# Patient Record
Sex: Male | Born: 1955 | Race: Black or African American | Hispanic: No | Smoking: Current every day smoker
Health system: Southern US, Community
[De-identification: ages and names within clinical notes are randomized; demographics above are authoritative.]

## PROBLEM LIST (undated history)

## (undated) DIAGNOSIS — I639 Cerebral infarction, unspecified: Secondary | ICD-10-CM

## (undated) DIAGNOSIS — G8220 Paraplegia, unspecified: Secondary | ICD-10-CM

## (undated) DIAGNOSIS — F32A Depression, unspecified: Secondary | ICD-10-CM

## (undated) DIAGNOSIS — F329 Major depressive disorder, single episode, unspecified: Secondary | ICD-10-CM

## (undated) DIAGNOSIS — M545 Low back pain: Secondary | ICD-10-CM

## (undated) DIAGNOSIS — Z9289 Personal history of other medical treatment: Secondary | ICD-10-CM

## (undated) DIAGNOSIS — K219 Gastro-esophageal reflux disease without esophagitis: Secondary | ICD-10-CM

## (undated) DIAGNOSIS — I10 Essential (primary) hypertension: Secondary | ICD-10-CM

## (undated) DIAGNOSIS — D649 Anemia, unspecified: Secondary | ICD-10-CM

## (undated) DIAGNOSIS — B20 Human immunodeficiency virus [HIV] disease: Secondary | ICD-10-CM

## (undated) DIAGNOSIS — G8929 Other chronic pain: Secondary | ICD-10-CM

---
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
## 1997-05-04 ENCOUNTER — Encounter (INDEPENDENT_AMBULATORY_CARE_PROVIDER_SITE_OTHER): Payer: Self-pay | Admitting: *Deleted

## 2001-07-26 ENCOUNTER — Emergency Department (HOSPITAL_COMMUNITY): Admission: EM | Admit: 2001-07-26 | Discharge: 2001-07-26 | Payer: Self-pay | Admitting: Emergency Medicine

## 2003-04-24 ENCOUNTER — Emergency Department (HOSPITAL_COMMUNITY): Admission: EM | Admit: 2003-04-24 | Discharge: 2003-04-24 | Payer: Self-pay | Admitting: Emergency Medicine

## 2003-12-02 ENCOUNTER — Emergency Department (HOSPITAL_COMMUNITY): Admission: EM | Admit: 2003-12-02 | Discharge: 2003-12-02 | Payer: Self-pay | Admitting: Emergency Medicine

## 2004-06-25 ENCOUNTER — Ambulatory Visit (HOSPITAL_COMMUNITY): Admission: RE | Admit: 2004-06-25 | Discharge: 2004-06-25 | Payer: Self-pay | Admitting: Emergency Medicine

## 2004-07-22 ENCOUNTER — Ambulatory Visit: Payer: Self-pay | Admitting: Infectious Diseases

## 2004-07-22 ENCOUNTER — Encounter (INDEPENDENT_AMBULATORY_CARE_PROVIDER_SITE_OTHER): Payer: Self-pay | Admitting: *Deleted

## 2004-07-22 ENCOUNTER — Ambulatory Visit (HOSPITAL_COMMUNITY): Admission: RE | Admit: 2004-07-22 | Discharge: 2004-07-22 | Payer: Self-pay | Admitting: Infectious Diseases

## 2004-09-03 ENCOUNTER — Ambulatory Visit: Payer: Self-pay | Admitting: Infectious Diseases

## 2004-11-05 ENCOUNTER — Ambulatory Visit: Payer: Self-pay | Admitting: Internal Medicine

## 2004-11-12 ENCOUNTER — Ambulatory Visit: Payer: Self-pay | Admitting: Infectious Diseases

## 2004-12-10 ENCOUNTER — Emergency Department (HOSPITAL_COMMUNITY): Admission: EM | Admit: 2004-12-10 | Discharge: 2004-12-11 | Payer: Self-pay | Admitting: Emergency Medicine

## 2005-01-03 ENCOUNTER — Ambulatory Visit: Payer: Self-pay | Admitting: Infectious Diseases

## 2005-01-16 ENCOUNTER — Ambulatory Visit: Payer: Self-pay | Admitting: Infectious Diseases

## 2005-01-28 ENCOUNTER — Ambulatory Visit: Payer: Self-pay | Admitting: Infectious Diseases

## 2005-02-25 ENCOUNTER — Ambulatory Visit: Payer: Self-pay | Admitting: Infectious Diseases

## 2005-03-24 ENCOUNTER — Ambulatory Visit: Payer: Self-pay | Admitting: Infectious Diseases

## 2005-05-28 ENCOUNTER — Ambulatory Visit: Payer: Self-pay | Admitting: Infectious Diseases

## 2005-10-06 ENCOUNTER — Encounter (INDEPENDENT_AMBULATORY_CARE_PROVIDER_SITE_OTHER): Payer: Self-pay | Admitting: *Deleted

## 2005-10-06 ENCOUNTER — Ambulatory Visit: Payer: Self-pay | Admitting: Infectious Diseases

## 2005-10-06 LAB — CONVERTED CEMR LAB: CD4 Count: 641 microliters

## 2005-10-07 ENCOUNTER — Ambulatory Visit (HOSPITAL_COMMUNITY): Admission: RE | Admit: 2005-10-07 | Discharge: 2005-10-07 | Payer: Self-pay | Admitting: Infectious Diseases

## 2005-10-08 ENCOUNTER — Ambulatory Visit: Payer: Self-pay | Admitting: Infectious Diseases

## 2005-10-24 ENCOUNTER — Ambulatory Visit: Payer: Self-pay | Admitting: Infectious Diseases

## 2005-10-24 ENCOUNTER — Ambulatory Visit (HOSPITAL_COMMUNITY): Admission: RE | Admit: 2005-10-24 | Discharge: 2005-10-24 | Payer: Self-pay | Admitting: Infectious Diseases

## 2005-12-30 ENCOUNTER — Ambulatory Visit: Payer: Self-pay | Admitting: Infectious Diseases

## 2006-03-24 ENCOUNTER — Ambulatory Visit: Payer: Self-pay | Admitting: Infectious Diseases

## 2006-03-24 ENCOUNTER — Encounter (INDEPENDENT_AMBULATORY_CARE_PROVIDER_SITE_OTHER): Payer: Self-pay | Admitting: *Deleted

## 2006-03-24 LAB — CONVERTED CEMR LAB: HIV 1 RNA Quant: 3467 copies/mL

## 2006-10-26 ENCOUNTER — Encounter (INDEPENDENT_AMBULATORY_CARE_PROVIDER_SITE_OTHER): Payer: Self-pay | Admitting: *Deleted

## 2006-10-26 LAB — CONVERTED CEMR LAB

## 2006-11-08 ENCOUNTER — Encounter (INDEPENDENT_AMBULATORY_CARE_PROVIDER_SITE_OTHER): Payer: Self-pay | Admitting: *Deleted

## 2008-08-11 DIAGNOSIS — F17200 Nicotine dependence, unspecified, uncomplicated: Secondary | ICD-10-CM | POA: Insufficient documentation

## 2008-08-11 DIAGNOSIS — B20 Human immunodeficiency virus [HIV] disease: Secondary | ICD-10-CM | POA: Insufficient documentation

## 2008-08-11 DIAGNOSIS — I10 Essential (primary) hypertension: Secondary | ICD-10-CM

## 2008-08-11 DIAGNOSIS — Z8679 Personal history of other diseases of the circulatory system: Secondary | ICD-10-CM | POA: Insufficient documentation

## 2008-08-11 DIAGNOSIS — K219 Gastro-esophageal reflux disease without esophagitis: Secondary | ICD-10-CM

## 2008-08-11 DIAGNOSIS — Z87442 Personal history of urinary calculi: Secondary | ICD-10-CM

## 2008-09-01 DIAGNOSIS — Z9289 Personal history of other medical treatment: Secondary | ICD-10-CM

## 2008-09-01 DIAGNOSIS — I639 Cerebral infarction, unspecified: Secondary | ICD-10-CM

## 2008-09-01 HISTORY — DX: Cerebral infarction, unspecified: I63.9

## 2008-09-01 HISTORY — DX: Personal history of other medical treatment: Z92.89

## 2008-09-20 ENCOUNTER — Ambulatory Visit: Payer: Self-pay | Admitting: Infectious Diseases

## 2008-09-20 LAB — CONVERTED CEMR LAB
ALT: 14 units/L (ref 0–53)
Albumin: 4.7 g/dL (ref 3.5–5.2)
BUN: 8 mg/dL (ref 6–23)
Bacteria, UA: NONE SEEN
CO2: 20 meq/L (ref 19–32)
Calcium: 9.3 mg/dL (ref 8.4–10.5)
Chloride: 103 meq/L (ref 96–112)
Cholesterol: 145 mg/dL (ref 0–200)
Creatinine, Ser: 0.94 mg/dL (ref 0.40–1.50)
GC Probe Amp, Urine: NEGATIVE
HCT: 39 % (ref 39.0–52.0)
Hemoglobin, Urine: NEGATIVE
Hemoglobin: 13.1 g/dL (ref 13.0–17.0)
Leukocytes, UA: NEGATIVE
Nitrite: NEGATIVE
Platelets: 330 10*3/uL (ref 150–400)
Potassium: 4.5 meq/L (ref 3.5–5.3)
RBC / HPF: NONE SEEN (ref <3)
Specific Gravity, Urine: 1.024 (ref 1.005–1.03)
Total CHOL/HDL Ratio: 3.4
Urine Glucose: NEGATIVE mg/dL
WBC: 5.8 10*3/uL (ref 4.0–10.5)
pH: 6 (ref 5.0–8.0)

## 2008-10-11 ENCOUNTER — Telehealth (INDEPENDENT_AMBULATORY_CARE_PROVIDER_SITE_OTHER): Payer: Self-pay | Admitting: *Deleted

## 2008-11-22 ENCOUNTER — Ambulatory Visit: Payer: Self-pay | Admitting: Infectious Diseases

## 2008-11-22 DIAGNOSIS — H6120 Impacted cerumen, unspecified ear: Secondary | ICD-10-CM

## 2008-11-22 DIAGNOSIS — F528 Other sexual dysfunction not due to a substance or known physiological condition: Secondary | ICD-10-CM

## 2008-11-27 ENCOUNTER — Encounter (INDEPENDENT_AMBULATORY_CARE_PROVIDER_SITE_OTHER): Payer: Self-pay | Admitting: *Deleted

## 2008-12-05 ENCOUNTER — Encounter: Payer: Self-pay | Admitting: Infectious Diseases

## 2008-12-15 ENCOUNTER — Telehealth (INDEPENDENT_AMBULATORY_CARE_PROVIDER_SITE_OTHER): Payer: Self-pay | Admitting: *Deleted

## 2009-03-19 ENCOUNTER — Encounter (INDEPENDENT_AMBULATORY_CARE_PROVIDER_SITE_OTHER): Payer: Self-pay | Admitting: *Deleted

## 2009-08-15 ENCOUNTER — Encounter: Payer: Self-pay | Admitting: Emergency Medicine

## 2009-08-15 ENCOUNTER — Inpatient Hospital Stay (HOSPITAL_COMMUNITY): Admission: AD | Admit: 2009-08-15 | Discharge: 2009-10-12 | Payer: Self-pay | Admitting: Internal Medicine

## 2009-08-15 ENCOUNTER — Ambulatory Visit: Payer: Self-pay | Admitting: Internal Medicine

## 2009-08-16 ENCOUNTER — Ambulatory Visit: Payer: Self-pay | Admitting: Physical Medicine & Rehabilitation

## 2009-08-16 ENCOUNTER — Encounter (INDEPENDENT_AMBULATORY_CARE_PROVIDER_SITE_OTHER): Payer: Self-pay | Admitting: Neurology

## 2009-08-17 ENCOUNTER — Ambulatory Visit: Payer: Self-pay | Admitting: Infectious Diseases

## 2009-08-30 ENCOUNTER — Encounter (INDEPENDENT_AMBULATORY_CARE_PROVIDER_SITE_OTHER): Payer: Self-pay | Admitting: Cardiology

## 2009-09-01 HISTORY — PX: TOE AMPUTATION: SHX809

## 2009-09-03 ENCOUNTER — Ambulatory Visit: Payer: Self-pay | Admitting: Vascular Surgery

## 2009-09-19 ENCOUNTER — Ambulatory Visit: Payer: Self-pay | Admitting: Vascular Surgery

## 2009-09-20 ENCOUNTER — Encounter: Payer: Self-pay | Admitting: Infectious Diseases

## 2009-09-20 ENCOUNTER — Encounter: Payer: Self-pay | Admitting: Internal Medicine

## 2009-10-19 ENCOUNTER — Encounter: Payer: Self-pay | Admitting: Infectious Diseases

## 2009-10-24 ENCOUNTER — Ambulatory Visit: Payer: Self-pay | Admitting: Infectious Diseases

## 2009-10-24 DIAGNOSIS — IMO0002 Reserved for concepts with insufficient information to code with codable children: Secondary | ICD-10-CM

## 2009-10-24 DIAGNOSIS — G8220 Paraplegia, unspecified: Secondary | ICD-10-CM

## 2009-10-24 LAB — CONVERTED CEMR LAB
ALT: 48 units/L (ref 0–53)
AST: 51 units/L — ABNORMAL HIGH (ref 0–37)
BUN: 12 mg/dL (ref 6–23)
Basophils Relative: 0 % (ref 0–1)
Calcium: 9.7 mg/dL (ref 8.4–10.5)
Chloride: 104 meq/L (ref 96–112)
Creatinine, Ser: 0.94 mg/dL (ref 0.40–1.50)
Eosinophils Absolute: 0.3 10*3/uL (ref 0.0–0.7)
Eosinophils Relative: 3 % (ref 0–5)
HCT: 31.8 % — ABNORMAL LOW (ref 39.0–52.0)
MCHC: 32.1 g/dL (ref 30.0–36.0)
MCV: 86.9 fL (ref >=78.0)
Neutrophils Relative %: 51 % (ref 43–77)
Platelets: 758 10*3/uL — ABNORMAL HIGH (ref 150–400)
RDW: 15.9 % — ABNORMAL HIGH (ref 11.5–15.5)
Total Bilirubin: 0.4 mg/dL (ref 0.3–1.2)

## 2009-10-25 ENCOUNTER — Ambulatory Visit: Payer: Self-pay | Admitting: Internal Medicine

## 2009-10-25 DIAGNOSIS — K2990 Gastroduodenitis, unspecified, without bleeding: Secondary | ICD-10-CM

## 2009-10-25 DIAGNOSIS — M2060 Acquired deformities of toe(s), unspecified, unspecified foot: Secondary | ICD-10-CM | POA: Insufficient documentation

## 2009-10-25 DIAGNOSIS — K2970 Gastritis, unspecified, without bleeding: Secondary | ICD-10-CM

## 2009-10-26 ENCOUNTER — Encounter: Payer: Self-pay | Admitting: Infectious Diseases

## 2009-10-29 ENCOUNTER — Encounter: Payer: Self-pay | Admitting: Infectious Diseases

## 2009-10-29 LAB — CONVERTED CEMR LAB: HIV-1 RNA Quant, Log: 4.09 — ABNORMAL HIGH (ref <1.68)

## 2009-11-14 ENCOUNTER — Ambulatory Visit (HOSPITAL_COMMUNITY): Admission: RE | Admit: 2009-11-14 | Discharge: 2009-11-14 | Payer: Self-pay | Admitting: General Surgery

## 2009-11-22 ENCOUNTER — Ambulatory Visit (HOSPITAL_COMMUNITY): Admission: RE | Admit: 2009-11-22 | Discharge: 2009-11-22 | Payer: Self-pay | Admitting: Orthopedic Surgery

## 2010-02-05 ENCOUNTER — Ambulatory Visit: Payer: Self-pay | Admitting: Internal Medicine

## 2010-02-05 ENCOUNTER — Encounter: Payer: Self-pay | Admitting: Internal Medicine

## 2010-02-05 ENCOUNTER — Inpatient Hospital Stay (HOSPITAL_COMMUNITY): Admission: EM | Admit: 2010-02-05 | Discharge: 2010-02-15 | Payer: Self-pay | Admitting: Emergency Medicine

## 2010-02-08 ENCOUNTER — Encounter: Payer: Self-pay | Admitting: Internal Medicine

## 2010-02-12 ENCOUNTER — Encounter: Payer: Self-pay | Admitting: Internal Medicine

## 2010-02-12 ENCOUNTER — Ambulatory Visit: Payer: Self-pay | Admitting: Surgery

## 2010-02-13 ENCOUNTER — Ambulatory Visit: Payer: Self-pay | Admitting: Internal Medicine

## 2010-02-15 ENCOUNTER — Encounter: Payer: Self-pay | Admitting: Internal Medicine

## 2010-02-15 DIAGNOSIS — N2589 Other disorders resulting from impaired renal tubular function: Secondary | ICD-10-CM | POA: Insufficient documentation

## 2010-02-15 DIAGNOSIS — D638 Anemia in other chronic diseases classified elsewhere: Secondary | ICD-10-CM | POA: Insufficient documentation

## 2010-02-15 DIAGNOSIS — E872 Acidosis: Secondary | ICD-10-CM

## 2010-02-18 ENCOUNTER — Ambulatory Visit: Payer: Self-pay | Admitting: Internal Medicine

## 2010-02-20 LAB — CONVERTED CEMR LAB
CO2: 22 meq/L (ref 19–32)
Calcium: 8.9 mg/dL (ref 8.4–10.5)
Creatinine, Ser: 1.37 mg/dL (ref 0.40–1.50)
Glucose, Bld: 140 mg/dL — ABNORMAL HIGH (ref 70–99)
Phosphorus: 2.4 mg/dL (ref 2.3–4.6)
Prothrombin Time: 13.2 s (ref 11.6–15.2)
Sodium: 136 meq/L (ref 135–145)
aPTT: 31 s

## 2010-03-18 ENCOUNTER — Ambulatory Visit: Payer: Self-pay | Admitting: Infectious Diseases

## 2010-07-30 ENCOUNTER — Encounter: Payer: Self-pay | Admitting: Internal Medicine

## 2010-07-30 ENCOUNTER — Ambulatory Visit: Payer: Self-pay | Admitting: Infectious Diseases

## 2010-07-30 LAB — CONVERTED CEMR LAB
AST: 17 units/L (ref 0–37)
Alkaline Phosphatase: 103 units/L (ref 39–117)
BUN: 20 mg/dL (ref 6–23)
Basophils Relative: 0 % (ref 0–1)
Creatinine, Ser: 0.8 mg/dL (ref 0.40–1.50)
Eosinophils Absolute: 0.2 10*3/uL (ref 0.0–0.7)
Eosinophils Relative: 3 % (ref 0–5)
Glucose, Bld: 90 mg/dL (ref 70–99)
HCT: 33.8 % — ABNORMAL LOW (ref 39.0–52.0)
HIV 1 RNA Quant: <20 copies/mL (ref <20)
HIV-1 RNA Quant, Log: <1.3 (ref <1.30)
Hemoglobin: 11.4 g/dL — ABNORMAL LOW (ref 13.0–17.0)
MCHC: 33.7 g/dL (ref 30.0–36.0)
MCV: 97.1 fL (ref 78.0–100.0)
Monocytes Absolute: 0.8 10*3/uL (ref 0.1–1.0)
Monocytes Relative: 10 % (ref 3–12)
Neutro Abs: 4.1 10*3/uL (ref 1.7–7.7)
RBC: 3.48 M/uL — ABNORMAL LOW (ref 4.22–5.81)
RDW: 14.2 % (ref 11.5–15.5)
Total Bilirubin: 0.5 mg/dL (ref 0.3–1.2)

## 2010-08-15 ENCOUNTER — Encounter
Admission: RE | Admit: 2010-08-15 | Discharge: 2010-08-15 | Payer: Self-pay | Source: Home / Self Care | Attending: Internal Medicine | Admitting: Internal Medicine

## 2010-09-25 ENCOUNTER — Encounter: Payer: Self-pay | Admitting: Orthopedic Surgery

## 2010-10-01 NOTE — Consult Note (Signed)
Summary: [facility]   Imported By: Florinda Marker 11/20/2009 14:43:22  _____________________________________________________________________  External Attachment:    Type:   Image     Comment:   External Document

## 2010-10-01 NOTE — Discharge Summary (Signed)
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  His initial Hb was 6.9.  He received 2 U RBCs with improvement in Hb, however it slowly trended downwards.  He was initially heme occult negative, but repeat was positive.  GI was consulted and conducted an EGD, which showed gastric mass/scar.  Biopsy showed benign inflammation and gastritis.  The patient continued to have a slow downward trend in Hb, and Heme/Onc was consulted, who recommended BM bx.  This was done on day of discharge.  Patient's Hb stabilized at 8.4.  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  All HAART regimen held and bicarb therapy started with improvement in renal function.  Needs follow-up B-met to assess.  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) Added new problem of OTH SPEC D/O RESULT FROM IMPAIRED RENAL FUNCTION (ICD-588.89) Added new problem of ANEMIA OF OTHER CHRONIC DISEASE (ICD-285.29)  Medication list changes:  Removed medication of TRUVADA 200-300 MG TABS (EMTRICITABINE-TENOFOVIR) Take 1 tablet by mouth once a day Removed medication of NORVIR 100 MG TABS (RITONAVIR) Take 1 tablet by mouth once a day Removed medication of PREZISTA 400 MG TABS (DARUNAVIR ETHANOLATE) 2 tab by mouth once daily Added new medication of MAG-OX 400 400 MG TABS (MAGNESIUM OXIDE) Take 1 tab by mouth two times  a day Removed medication of METOPROLOL TARTRATE 25 MG TABS (METOPROLOL TARTRATE) 1 tab two times a day Changed medication from ZOLOFT 100 MG TABS (SERTRALINE HCL) Take 1 tablet by mouth once a day to ZOLOFT 100 MG TABS (SERTRALINE HCL) Take 1 tab by mouth daily Added new medication of ZOLOFT 50 MG TABS (SERTRALINE HCL) Take 1 tab by mouth daily Added new medication of AMBIEN 5 MG TABS (ZOLPIDEM TARTRATE) Take 1 tab by mouth at bedtime Changed medication from PROTONIX 40 MG TBEC (PANTOPRAZOLE SODIUM) Take 1 tablet by mouth once a day to PRILOSEC 20 MG CPDR (OMEPRAZOLE) Take 1 cap by mouth daily Removed medication of NICODERM CQ 7 MG/24HR PT24 (NICOTINE) once daily  The medication, problem, and allergy lists have been updated.  Please see the dictated discharge summary for details.  Discharge medications:  MULTIVITAL  TABS (MULTIPLE VITAMINS-MINERALS) one daily WARFARIN SODIUM 2 MG TABS (WARFARIN SODIUM) Take 1 tablet by mouth once a day ZOLOFT 100 MG TABS (SERTRALINE HCL) Take 1 tab by mouth daily PRAVACHOL 40 MG TABS (PRAVASTATIN SODIUM) Take 1 tablet by mouth at bedtime PRILOSEC 20 MG CPDR (OMEPRAZOLE) Take 1 cap by mouth daily ENABLEX 7.5 MG XR24H-TAB (DARIFENACIN HYDROBROMIDE) Take 1 tablet by mouth once a day MAG-OX 400 400 MG TABS (MAGNESIUM OXIDE) Take 1 tab by mouth two times a day ZOLOFT 50 MG TABS (SERTRALINE HCL) Take 1 tab by mouth daily AMBIEN 5 MG TABS (ZOLPIDEM TARTRATE) Take 1 tab by mouth at bedtime  Other patient instructions:  1) Follow up with Dr. Aleene Davidson in the [facility] outpatient clinic on  Monday, June 20 at 1:30 pm.  Please arrive by 1 pm.  If you have questions please call [phone]. 2) Please follow up with Dr. Ninetta Lights in the [facility] on July 18 at 9:45 am.  Please call [phone] with questions.   3) Please review your new medication list and take all medications as prescribed.  New medications in clude Mag-Ox 400 mg  tab twice a day.   4) Return to the emergency room if you have fevers, chills, chest pain, blackouts, persistent nausea or vomiting, or acute numbness, tingling, or weakness.      Note: Hospital Discharge Medications & Other Instructions handout was printed, one copy for patient and a second copy to be placed in hospital chart.

## 2010-10-01 NOTE — Assessment & Plan Note (Signed)
Summary: HFU-PER DR. MILLS STAT LABS PT COMING AT 12:30P.M./[facility]   Vital Signs:  Patient profile:   55 year old male Height:      72 inches (182.88 cm) Weight:      116.1 pounds (52.77 kg) BMI:     15.80 Temp:     97.1 degrees F (36.17 degrees C) oral Pulse rate:   86 / minute BP sitting:   150 / 93  (left arm)  Vitals Entered By: Kent Kidney Ditzler RN (February 18, 2010 2:06 PM) Is Patient Diabetic? No Pain Assessment Patient in pain? no      Nutritional Status BMI of < 19 = underweight Nutritional Status Detail appetite good  Have you ever been in a relationship where you felt threatened, hurt or afraid?denies   Does patient need assistance? Functional Status Self care Ambulation Wheelchair Comments HFU - doing well.   History of Present Illness: Kent Barnes comes for a hfu. No black stool. No vomiting or hematemesis. He feels fine. Mood is good. No SI. He says life is "fine".   Depression History:      The patient denies a depressed mood most of the day and a diminished interest in his usual daily activities.         Preventive Screening-Counseling & Management  Alcohol-Tobacco     Alcohol drinks/day: 0     Alcohol type: beer     Smoking Status: quit     Smoking Cessation Counseling: yes     Packs/Day: 0.25     Year Quit: 2010  Caffeine-Diet-Exercise     Caffeine use/day: coffee sometimes,tea,sodas occassionally     Does Patient Exercise: yes     Type of exercise: PT     Exercise (avg: min/session): >60     Times/week: 5  Current Medications (verified): 1)  Multivital  Tabs (Multiple Vitamins-Minerals) .... One Daily 2)  Warfarin Sodium 2 Mg Tabs (Warfarin Sodium) .... Take 1 Tablet By Mouth Once A Day 3)  Zoloft 100 Mg Tabs (Sertraline Hcl) .... Take 1 Tab By Mouth Daily 4)  Pravachol 40 Mg Tabs (Pravastatin Sodium) .... Take 1 Tablet By Mouth At Bedtime 5)  Prilosec 20 Mg Cpdr (Omeprazole) .... Take 1 Cap By Mouth Daily 6)  Enablex 7.5 Mg Xr24h-Tab  (Darifenacin Hydrobromide) .... Take 1 Tablet By Mouth Once A Day 7)  Mag-Ox 400 400 Mg Tabs (Magnesium Oxide) .... Take 1 Tab By Mouth Two Times A Day 8)  Zoloft 50 Mg Tabs (Sertraline Hcl) .... Take 1 Tab By Mouth Daily 9)  Ambien 5 Mg Tabs (Zolpidem Tartrate) .... Take 1 Tab By Mouth At Bedtime  Allergies: 1)  ! Septra 2)  ! Codeine 3)  ! Sulfa  Review of Systems      See HPI  Physical Exam  Lungs:  normal breath sounds, no crackles, and no wheezes.   Heart:  normal rate, regular rhythm, no murmur, and no gallop.   Extremities:  trace left pedal edema and trace right pedal edema.     Impression & Recommendations:  Problem # 1:  OTH SPEC D/O RESULT FROM IMPAIRED RENAL FUNCTION (ICD-588.89) As recommended in his recent discharge summary, I will check following labs. Will start his HCo3 pills if his HCO# is <20, it was 22 at the time of discharge.  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  Orders: T-Basic Metabolic Panel [id number]) T-Magnesium [id number]) T-Phosphorus [phone])  Problem #  3:  CHRN/UNS GASTR ULCR W/HEMORR&PERF W/O OBST (ICD-531.60) Will check PT/aPTT.  His updated medication list for this problem includes:    Prilosec 20 Mg Cpdr (Omeprazole) .Marland Kitchen... Take 1 cap by mouth daily    Mag-ox 400 400 Mg Tabs (Magnesium oxide) .Marland Kitchen... Take 1 tab by mouth two times a day  Problem # 4:  HYPERTENSION (ICD-401.9)  BP little elevated today. His BP was 110-140/80s in the hospital. WIll start on low dose norvasc as this does not interact with any eletrolytes and f/u in a month.  BP today: 150/93 Prior BP: 132/87 (10/25/2009)  Labs Reviewed: K+: 4.2 (10/24/2009) Creat: : 0.94 (10/24/2009)   Chol: 145 (09/20/2008)   HDL: 43 (09/20/2008)   LDL: 72 (09/20/2008)   TG: 148 (09/20/2008)  His updated medication list for this problem includes:    Norvasc  5 Mg Tabs (Amlodipine besylate) .Marland Kitchen... Take 1 pill by mouth daily.  Complete Medication List: 1)  Multivital Tabs (Multiple vitamins-minerals) .... One daily 2)  Warfarin Sodium 2 Mg Tabs (Warfarin sodium) .... Take 1 tablet by mouth once a day 3)  Zoloft 100 Mg Tabs (Sertraline hcl) .... Take 1 tab by mouth daily 4)  Pravachol 40 Mg Tabs (Pravastatin sodium) .... Take 1 tablet by mouth at bedtime 5)  Prilosec 20 Mg Cpdr (Omeprazole) .... Take 1 cap by mouth daily 6)  Enablex 7.5 Mg Xr24h-tab (Darifenacin hydrobromide) .... Take 1 tablet by mouth once a day 7)  Mag-ox 400 400 Mg Tabs (Magnesium oxide) .... Take 1 tab by mouth two times a day 8)  Zoloft 50 Mg Tabs (Sertraline hcl) .... Take 1 tab by mouth daily 9)  Ambien 5 Mg Tabs (Zolpidem tartrate) .... Take 1 tab by mouth at bedtime 10)  Norvasc 5 Mg Tabs (Amlodipine besylate) .... Take 1 pill by mouth daily.  Other Orders: T-PTT ([id number]) T-Protime, Auto ([id number])  Patient Instructions: 1)  Please schedule a follow-up appointment in 1 month. 2)  Limit your Sodium (Salt) to less than 2 grams a day(slightly less than 1/2 a teaspoon) to prevent fluid retention, swelling, or worsening of symptoms. 3)  Tobacco is very bad for your health and your loved ones! You Should stop smoking!. 4)  Stop Smoking Tips: Choose a Quit date. Cut down before the Quit date. decide what you will do as a substitute when you feel the urge to smoke(gum,toothpick,exercise). 5)  Check your Blood Pressure regularly. If it is above: you should make an appointment. Prescriptions: NORVASC 5 MG TABS (AMLODIPINE BESYLATE) take 1 pill by mouth daily.  #30 x 0   Entered and Authorized by:   Jason Coop MD   Signed by:   Jason Coop MD on 02/18/2010   Method used:   Print then Give to Patient   RxID:   [id number]  Process Orders Check Orders Results:     [facility]: ABN not required for this insurance Tests Sent for  requisitioning (February 18, 2010 2:39 PM):     02/18/2010: [facility] -- T-Basic Metabolic Panel [id number] (signed)     02/18/2010: [facility] -- T-PTT [[id number]] (signed)     02/18/2010: [facility] -- T-Protime, Auto [[id number]] (signed)     02/18/2010: [facility] -- T-Magnesium [[id number]] (signed)     02/18/2010: [facility] -- T-Phosphorus [[id number]] (signed)    Prevention & Chronic Care Immunizations   Influenza vaccine: Historical  (07/12/2008)    Tetanus booster: Not documented  Pneumococcal vaccine: Historical  (08/01/2004)  Colorectal Screening   Hemoccult: Not documented    Colonoscopy: Not documented  Other Screening   PSA: Not documented   Smoking status: quit  (02/18/2010)  Lipids   Total Cholesterol: 145  (09/20/2008)   LDL: 72  (09/20/2008)   LDL Direct: Not documented   HDL: 43  (09/20/2008)   Triglycerides: 148  (09/20/2008)  Hypertension   Last Blood Pressure: 150 / 93  (02/18/2010)   Serum creatinine: 0.94  (10/24/2009)   Serum potassium 4.2  (10/24/2009)  Self-Management Support :   Personal Goals (by the next clinic visit) :      Personal blood pressure goal: 140/90  (02/18/2010)   Patient will work on the following items until the next clinic visit to reach self-care goals:     Medications and monitoring: take my medicines every day, check my blood pressure, bring all of my medications to every visit, weigh myself weekly  (02/18/2010)     Eating: drink diet soda or water instead of juice or soda, eat more vegetables, use fresh or frozen vegetables, eat foods that are low in salt, eat fruit for snacks and desserts, limit or avoid alcohol  (02/18/2010)     Activity: take a 30 minute walk every day  (02/18/2010)    Hypertension self-management support: Written self-care plan, Education handout, Resources for patients handout  (02/18/2010)   Hypertension  self-care plan printed.   Hypertension education handout printed      Resource handout printed.  Process Orders Check Orders Results:     [facility]: ABN not required for this insurance Tests Sent for requisitioning (February 18, 2010 2:39 PM):     02/18/2010: [facility] -- T-Basic Metabolic Panel [phone] (signed)     02/18/2010: [facility] -- T-PTT [[id number]] (signed)     02/18/2010: [facility] -- T-Protime, Auto [phone] (signed)     02/18/2010: [facility] -- T-Magnesium [[id number]] (signed)     02/18/2010: [facility] -- T-Phosphorus [[id number]] (signed)

## 2010-10-01 NOTE — Assessment & Plan Note (Signed)
Summary: HFU/gg   Vital Signs:  Patient profile:   55 year old male Height:      72 inches Temp:     97.1 degrees F oral Pulse rate:   82 / minute BP sitting:   132 / 87  (right arm)  Vitals Entered By: Filomena Jungling NT II (October 25, 2009 2:02 PM) CC: HFU Is Patient Diabetic? No Pain Assessment Patient in pain? no       Have you ever been in a relationship where you felt threatened, hurt or afraid?No   Does patient need assistance? Functional Status Self care Ambulation Impaired:Risk for fall, Wheelchair Comments IN [facility]  -   CC:  HFU.  History of Present Illness: 55 yo M with PMH outlined in the EMR. He presents today for hospital follow up from extensive (52 day) hospitalization and multiple medical problems.  1. Spinal infact/paraplegia: pt is doing very well with intensive PT and SNF.  He is able to perfom most of his ADLs without assistance.  Muscle strength continues to improve.  2. RUQ collection 2/2 gastric perf and splenic infarct:  tolerating by mouth very well.  No abdominal pain, nausea, or vomiting.  He requests advance from pureed to regular/soft diet.  Has f/u scheduled for later with week with GI for possible EGD; during his hospital course there was a question about the possibility of primary gastric ca causing the perf.  He also has f/u with [facility] for repeat abdominal imaging with CT in a few weeks.  3. R ischemic 4th toe: toe is still black and necrotic; has not auto-amputated yet.  Stable.  4. Anticoagulation for mulitple systemic emboli: taking coumadin regularly.    5. HIV: on Atripla as well as toxo ppx. Reports compliance with meds.  Followed by Dr. Ninetta Lights.  6. EAV:WUJWJX.  7. Cachexia:  pt is slowly gaining weight as he is able to eat increase amounts of food.  8.  Tobacco use:  pt has not started smoking after discharge from hospitalization.  He is also no longer using the nicotine patch.  9. Hx EtOH use:  denies EtOH  use.     Preventive Screening-Counseling & Management  Alcohol-Tobacco     Alcohol drinks/day: 0     Alcohol type: beer     Smoking Status: quit     Smoking Cessation Counseling: yes     Packs/Day: 0.25     Year Quit: 2010  Caffeine-Diet-Exercise     Caffeine use/day: coffee sometimes,tea,sodas occassionally     Does Patient Exercise: yes     Type of exercise: PT     Exercise (avg: min/session): >60     Times/week: 5  Current Problems (verified): 1)  Unspecified Acquired Deformity of Toe  (ICD-735.9) 2)  Unspec Site Sp Cord Injury w/o Sp Bn Injury  (ICD-952.9) 3)  Paraplegia  (ICD-344.1) 4)  Cerumen Impaction, Bilateral  (ICD-380.4) 5)  Erectile Dysfunction  (ICD-302.72) 6)  Tobacco Abuse  (ICD-305.1) 7)  Nephrolithiasis, Hx of  (ICD-V13.01) 8)  Transient Ischemic Attack, Hx of  (ICD-V12.50) 9)  Hypertension  (ICD-401.9) 10)  HIV Disease  (ICD-042) 11)  Gerd  (ICD-530.81)  Current Medications (verified): 1)  Multivital  Tabs (Multiple Vitamins-Minerals) .... One Daily 2)  Warfarin Sodium 2 Mg Tabs (Warfarin Sodium) .... Take 1 Tablet By Mouth Once A Day 3)  Zoloft 100 Mg Tabs (Sertraline Hcl) .... Take 1 Tablet By Mouth Once A Day 4)  Pravachol 40 Mg Tabs (Pravastatin Sodium) .Marland KitchenMarland KitchenMarland Kitchen  Take 1 Tablet By Mouth At Bedtime 5)  Protonix 40 Mg Tbec (Pantoprazole Sodium) .... Take 1 Tablet By Mouth Once A Day 6)  Nicoderm Cq 7 Mg/24hr Pt24 (Nicotine) .... Once Daily 7)  Metoprolol Tartrate 25 Mg Tabs (Metoprolol Tartrate) .Marland Kitchen.. 1 Tab Two Times A Day 8)  Enablex 7.5 Mg Xr24h-Tab (Darifenacin Hydrobromide) .... Take 1 Tablet By Mouth Once A Day 9)  Truvada 200-300 Mg Tabs (Emtricitabine-Tenofovir) .... Take 1 Tablet By Mouth Once A Day 10)  Norvir 100 Mg Tabs (Ritonavir) .... Take 1 Tablet By Mouth Once A Day 11)  Prezista 400 Mg Tabs (Darunavir Ethanolate) .... 2 Tab By Mouth Once Daily  Allergies: 1)  ! Septra 2)  ! Codeine 3)  ! Sulfa  Past History:  Past medical, surgical,  family and social histories (including risk factors) reviewed, and no changes noted (except as noted below).  Past Medical History: Reviewed history from 08/11/2008 and no changes required. GERD HIV disease Hypertension Transient ischemic attack, hx of Nephrolithiasis, hx of Tobacco Abuse  Family History: Reviewed history from 09/20/2008 and no changes required. Family History Hypertension (mother, also with dementia)  Social History: Reviewed history from 10/24/2009 and no changes required.  Alcohol use-yes, previously occas.  risk MSM Former Smoker  Physical Exam  General:  Thin but healthy appearing. Head:  normocephalic and atraumatic.   Eyes:  vision grossly intact, pupils equal, pupils round, and pupils reactive to light.  Sclerae and conjunctivae unremarkable. Mouth:  pharynx pink and moist, no erythema, no exudates, and poor dentition.   Neck:  supple, full ROM, and no masses.   Lungs:  normal respiratory effort, no accessory muscle use, normal breath sounds, no crackles, and no wheezes.   Heart:  normal rate, regular rhythm, no murmur, no gallop, and no rub.   Abdomen:  soft, non-tender, normal bowel sounds, no distention, no masses, no guarding, no rigidity, and no rebound tenderness.   Msk:  Right 4th toe is black, dry, necrotic.  No surrounding erythema or induration present;surrounding tissue appears very healthy.    Extremities:  No clubbing, edema, or abnormality other than described above in MSK. Neurologic:  alert & oriented X3 and cranial nerves II-XII intact.  Diminished sensation in bilateral LEs.  +3 muscle strength in bilateral LE. +4-5 in upper ext.  Pt is wheelchair bound. Skin:  no rashes, no suspicious lesions, no purpura, no ulcerations, and no edema.   Psych:  memory intact for recent and remote, normally interactive, good eye contact, not anxious appearing, and not depressed appearing.     Impression & Recommendations:  Problem # 1:  UNSPECIFIED  ACQUIRED DEFORMITY OF TOE (ICD-735.9) Right ischemic fourth toe is stable; no need for abx therapy or urgent amputation.  Pt was seen by ortho during his hospitalization.  Will refer him for outpatient evaluation.  Now that he is clinicall stable, it may be beneficial to proceed with amputation.  advance diet FLP next visit ortho referral  Orders: Orthopedic Surgeon Referral (Ortho Surgeon)  Problem # 2:  CHRN/UNS GASTR ULCR W/HEMORR&PERF W/O OBST (ICD-531.60) Pt is tolerating his pureed diet very well and is without nausea, vomiting, or abdominal pain.  WIll advance his diet to regular.  Will f/u records from GI visit tomorrow.  His updated medication list for this problem includes:    Protonix 40 Mg Tbec (Pantoprazole sodium) .Marland Kitchen... Take 1 tablet by mouth once a day  Problem # 3:  HYPERTENSION (ICD-401.9) Stable.   His updated  medication list for this problem includes:    Metoprolol Tartrate 25 Mg Tabs (Metoprolol tartrate) .Marland Kitchen... 1 tab two times a day  Problem # 4:  GERD (ICD-530.81) Symptoms well controlled with protonix. His updated medication list for this problem includes:    Protonix 40 Mg Tbec (Pantoprazole sodium) .Marland Kitchen... Take 1 tablet by mouth once a day  Problem # 5:  PARAPLEGIA (ICD-344.1) Mr. Strupp continues to do very well with PT/OT and is now able to perform most of his ADLs without assistance.  Problem # 6:  TOBACCO ABUSE (ICD-305.1) Mr. Dedman has sucessfully quit smoking following his discharge from the hospital. Congratulated him for his accomplishment and encouraged him to remain tobacco free.   His updated medication list for this problem includes:    Nicoderm Cq 7 Mg/24hr Pt24 (Nicotine) ..... Once daily  Complete Medication List: 1)  Multivital Tabs (Multiple vitamins-minerals) .... One daily 2)  Warfarin Sodium 2 Mg Tabs (Warfarin sodium) .... Take 1 tablet by mouth once a day 3)  Zoloft 100 Mg Tabs (Sertraline hcl) .... Take 1 tablet by mouth once a  day 4)  Pravachol 40 Mg Tabs (Pravastatin sodium) .... Take 1 tablet by mouth at bedtime 5)  Protonix 40 Mg Tbec (Pantoprazole sodium) .... Take 1 tablet by mouth once a day 6)  Nicoderm Cq 7 Mg/24hr Pt24 (Nicotine) .... Once daily 7)  Metoprolol Tartrate 25 Mg Tabs (Metoprolol tartrate) .Marland Kitchen.. 1 tab two times a day 8)  Enablex 7.5 Mg Xr24h-tab (Darifenacin hydrobromide) .... Take 1 tablet by mouth once a day 9)  Truvada 200-300 Mg Tabs (Emtricitabine-tenofovir) .... Take 1 tablet by mouth once a day 10)  Norvir 100 Mg Tabs (Ritonavir) .... Take 1 tablet by mouth once a day 11)  Prezista 400 Mg Tabs (Darunavir ethanolate) .... 2 tab by mouth once daily  Patient Instructions: 1)  Please schedule a folllow up appointment with Dr. Arvilla Market in April of 2011. 2)  Advised not to eat any food or drink any liquids after 10 PM the night before your appointment as we will need to obtain fasting labs, including a cholesterol panel. 3)  You may advance your diet to a regular diet. 4)  If you experience any abdominal pain, nausea, vomiting, or diarrhea please call the clinic or go to the ER.   Prevention & Chronic Care Immunizations   Influenza vaccine: Historical  (07/12/2008)    Tetanus booster: Not documented    Pneumococcal vaccine: Historical  (08/01/2004)  Colorectal Screening   Hemoccult: Not documented    Colonoscopy: Not documented  Other Screening   PSA: Not documented   Smoking status: quit  (10/25/2009)  Lipids   Total Cholesterol: 145  (09/20/2008)   LDL: 72  (09/20/2008)   LDL Direct: Not documented   HDL: 43  (09/20/2008)   Triglycerides: 148  (09/20/2008)  Hypertension   Last Blood Pressure: 132 / 87  (10/25/2009)   Serum creatinine: 0.94  (09/20/2008)   Serum potassium 4.5  (09/20/2008)  Self-Management Support :    Patient will work on the following items until the next clinic visit to reach self-care goals:     Medications and monitoring: take my medicines every  day  (10/25/2009)     Eating: eat more vegetables, use fresh or frozen vegetables, eat foods that are low in salt, eat baked foods instead of fried foods, eat fruit for snacks and desserts, limit or avoid alcohol  (10/25/2009)    Hypertension self-management support: Education handout,  Resources for patients handout  (10/25/2009)   Hypertension education handout printed      Resource handout printed.

## 2010-10-01 NOTE — Assessment & Plan Note (Signed)
Summary: HFU/VS   CC:  hsfu.  History of Present Illness: 55 yo M with hx of HIV+ since 1993. First seen in ID clinic 2005 and was previously on ATVr/TRV which he had been started on through the [facility]. Geno negative Jan 05, 2006 (due to detectable virus).   He was lost to f/u for several years and then adm to [facility] 08-15-09 to 10-11-09 with multiple emboli. 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 He then developed upper abd fluid collections (? abscesses). He was not a surgical candidate and he was started on coumadin therapyin the hospital. His course was also complicated by C diff. His CD4 in hospital was 30 and VL was 34,000 (geno K103N). repeated 09-19-09 VL 251, CD4 230. He was started on atripla. NOted to have Genotype 10-29-09 K103N,M184V,P225H Re-adm to hospital 6-7 to 02-15-10 and found to have renal failure, acidosis, anemia (colon negative, EGD Abnormal area in the proximal stomach representing a small mass versus scar tissue as well as submucosal lesion approximately 1 cm diameter in esophagus, probable lipoma. Pathology results revealed chronic active gastritis with ulceration and intestinal metaplasia.  No evidence of dysplasia or malignancy.).  His ART was stopped. His most recent Cr 1.37 (02-18-10). Feeeling well, eating "everything in site". has been walking, states he does not have bed sores and that he does not stay in bed long enough to get a bed sore.   Preventive Screening-Counseling & Management  Alcohol-Tobacco     Alcohol drinks/day: 0     Smoking Status: current     Smoking Cessation Counseling: yes     Packs/Day: <0.25  Caffeine-Diet-Exercise     Caffeine use/day: coffee sometimes,tea,sodas occassionally     Does Patient Exercise: yes     Type of exercise: PT     Exercise (avg: min/session): >60   Times/week: 5  Safety-Violence-Falls     Seat Belt Use: yes   Updated Prior Medication List: MULTIVITAL  TABS (MULTIPLE VITAMINS-MINERALS) one daily WARFARIN SODIUM 2 MG TABS (WARFARIN SODIUM) Take 1 tablet by mouth once a day ZOLOFT 100 MG TABS (SERTRALINE HCL) Take 1 tab by mouth daily PRAVACHOL 40 MG TABS (PRAVASTATIN SODIUM) Take 1 tablet by mouth at bedtime PRILOSEC 20 MG CPDR (OMEPRAZOLE) Take 1 cap by mouth daily ENABLEX 7.5 MG XR24H-TAB (DARIFENACIN HYDROBROMIDE) Take 1 tablet by mouth once a day MAG-OX 400 400 MG TABS (MAGNESIUM OXIDE) Take 1 tab by mouth two times a day ZOLOFT 50 MG TABS (SERTRALINE HCL) Take 1 tab by mouth daily AMBIEN 5 MG TABS (ZOLPIDEM TARTRATE) Take 1 tab by mouth at bedtime NORVASC 5 MG TABS (AMLODIPINE BESYLATE) take 1 pill by mouth daily.  Current Allergies (reviewed today): ! SEPTRA ! CODEINE ! SULFA Past History:  Past Medical History: GERD HIV disease Hypertension Transient ischemic attack, hx of Nephrolithiasis, hx of Tobacco Abuse ARF (secondary to TFV?)  Social History:  Alcohol use-yes, previously occas.  risk MSM Smoker  Vital Signs:  Patient profile:   55 year old male Height:      72 inches (182.88 cm) Temp:     98.1 degrees F (36.72 degrees C) oral Pulse rate:   93 / minute BP sitting:   126 / 80  (left arm)  Vitals Entered By: Baxter Hire) (March 18, 2010 10:31 AM) CC: hsfu  Pain Assessment Patient in pain? no      Nutritional Status Detail appetite is great per patient  Does patient need assistance? Functional Status Self care Ambulation Wheelchair Comments patient also uses a walker   Physical Exam  General:  well-developed, well-nourished, well-hydrated, and underweight appearing.   Eyes:  pupils equal, pupils round, and pupils reactive to light.   Mouth:  pharynx pink and moist and no exudates.   Neck:  no masses.   Lungs:  normal respiratory effort and normal breath sounds.   Heart:  normal  rate, regular rhythm, and no murmur.   Abdomen:  soft, non-tender, and normal bowel sounds.          Medication Adherence: 03/18/2010   Adherence to medications reviewed with patient. Counseling to provide adequate adherence provided                                Impression & Recommendations:  Problem # 1:  HIV DISEASE (ICD-042) will restart his ART. his PI should be intact, will add combivir (to give synergy from M184V), and isentress. His coumadin dose will need to be watched with the Darunivir. his anemia will need to be watched with the AZT. return to clinic 3 months.   Problem # 2:  TOBACCO ABUSE (ICD-305.1) enourage him to stop smoking.   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   Medications Added to Medication List This Visit: 1)  Prezista 400 Mg Tabs (Darunavir ethanolate) .... 2 tab by mouth once daily 2)  Norvir 100 Mg Tabs (Ritonavir) .Marland Kitchen.. 1 tab by mouth once daily 3)  Isentress 400 Mg Tabs (Raltegravir potassium) .... Take 1 tablet by mouth two times a day 4)  Combivir 150-300 Mg Tabs (Lamivudine-zidovudine) .... Take 1 tablet by mouth two times a day Prescriptions: COMBIVIR 150-300 MG TABS (LAMIVUDINE-ZIDOVUDINE) Take 1 tablet by mouth two times a day  #120 x 4   Entered and Authorized by:   Johny Sax MD   Signed by:   Johny Sax MD on 03/18/2010   Method used:   Print then Give to Patient   RxID:   [id number] ISENTRESS 400 MG TABS (RALTEGRAVIR POTASSIUM) Take 1 tablet by mouth two times a day  #120 x 4   Entered and Authorized by:   Johny Sax MD   Signed by:   Johny Sax MD on 03/18/2010   Method used:   Print then Give to Patient   RxID:   [id number] NORVIR 100 MG TABS (RITONAVIR) 1 tab by mouth once daily  #90 x 4   Entered and Authorized by:   Johny Sax MD   Signed by:   Johny Sax MD on 03/18/2010   Method used:   Print then Give  to Patient   RxID:   [id number] PREZISTA 400 MG TABS (DARUNAVIR ETHANOLATE) 2 tab by mouth once daily  #120 x 4   Entered and Authorized by:   Johny Sax MD   Signed by:   Johny Sax MD on 03/18/2010   Method used:   Print then Give to Patient   RxID:   [id number]   Appended Document: Orders Update    Clinical Lists Changes  Orders: Added new Service order of Est. Patient Level IV (38756) - Signed

## 2010-10-01 NOTE — Miscellaneous (Signed)
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

## 2010-10-01 NOTE — Miscellaneous (Signed)
Summary: Hospital Admission  INTERNAL MEDICINE ADMISSION HISTORY AND PHYSICAL  ***PLACE IN PROGRESS NOTES SECTION OF CHART***   Attending: Dr. Aundria Rud 1st contact: Dr. Arvilla Market [phone] 2nd contact: Dr. Comer Locket [phone]  Weekends, holidays, and after 5pm weekdays: 1st contact: [phone] 2nd contact: [phone]  PCP: Dr. Ninetta Lights CC:  Kent Barnes, acute kidney failure  HPI:  Kent Barnes is a 55 y/o M with hx HIV, HTN, and hx of multiple systemic embolic including a spinal infarct with subsequent bilateral LE parasthesia and very complicated hospital course from 08/2009-10/2009 who presents to the ER with c/o 2 weeks of progressive malaise and worsening kidney function.  He reports that approx 2-2.5 weeks PTA, he began to experience dysuria and noticed that his urine was very cloudy.  This was evaluated at his SNF and he was treated with a 2 week course of 2 antibiotics (he cannot recall the names) that was completed approx 1 week PTA.  He was also given 2L of IVFs for tx of dehydration.  He had repeat blood work done and was told that his kindeys were failing; he was subsequently brought to the ED.   He denies fever, chills, flank pain, diarrhea, abdominal pain, gross hematuria, syncope, dizziness, and chest pain.  He reports decreased appetite over the past 2 weeks with weight loss of 5-8lbs.  He admits to itermittent vomiting after taking his night-time meds as well as in the morning after breakfast; this is not a daily phenomenon.     During the course of his ED evaluation, his Hgb was found to be significantly low at 6.9.  He denies any BRBPR, dark black stools, abdominal pain, or other abnormal bleeding.    ALLERGIES: ! SEPTRA/SULFA: hives, rash ! CODEINE    PAST MEDICAL HISTORY: Systemic emboli.  Etiology unknown.     a.     Gastric wall perforation.     Barnes.     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     c.     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     d.      Ischemic right fourth toe.                    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 Lajoyce Corners)     e.     Chronic anticoagulation therapy with Coumadin.     f. Prolonged hospital stay from 08/15/2009 - 10/11/2009 Human immunodeficiency virus.      a. Last CD4 of 200 on 10/25/09, VL 12,400 Hypertension. Gastroesophageal reflux disease. Depression. Hx of Clostridium difficile colitis. History of alcohol abuse. History of tobacco abuse.   MEDICATIONS: MULTIVITAL  TABS (MULTIPLE VITAMINS-MINERALS) one daily WARFARIN SODIUM 2 MG TABS (WARFARIN SODIUM) Take 1 tablet by mouth once a day ZOLOFT 100 MG TABS (SERTRALINE HCL) Take 1 tablet by mouth once a day PRAVACHOL 40 MG TABS (PRAVASTATIN SODIUM) Take 1 tablet by mouth at bedtime PROTONIX 40 MG TBEC (PANTOPRAZOLE SODIUM) Take 1 tablet by mouth once a day NICODERM CQ 7 MG/24HR PT24 (NICOTINE) once daily METOPROLOL TARTRATE 25 MG TABS (METOPROLOL TARTRATE) 1 tab two times a day ENABLEX 7.5 MG XR24H-TAB (DARIFENACIN HYDROBROMIDE) Take 1 tablet by mouth once a day TRUVADA 200-300 MG TABS (EMTRICITABINE-TENOFOVIR) Take 1 tablet by mouth once a day NORVIR 100 MG TABS (RITONAVIR) Take 1 tablet by mouth once a day PREZISTA 400 MG TABS (DARUNAVIR ETHANOLATE) 2 tab by mouth once daily  SOCIAL HISTORY: Lives at SNF Alcohol use-yes, previously occas.  risk MSM Former Smoker Hx EtOH use; quit 08/2009   FAMILY HISTORY Hypertension (mother, also with dementia)   ROS: as per HPI, all other systems reviewed and negative   VITALS: T: 96.7 P: 60  BP: 107/72  R: 18 O2SAT: 99% ON: RA  PHYSICAL EXAM: General:  cachectic 55 y/o M, NAD, pleasant and cooperative, A&Ox3 Head:  normocephalic and atraumatic.   Eyes:  PERRLA, EOMI, vision grossly intact, conjuctive and sclerae within normal limits.   Mouth:  MM dry, no erythema, exudates, or lesions.  Poor dentition. Neck:  supple, full ROM, trachea midline, no palp masses, no JVD.   Lungs:  CTAB,  normal respiratory effort  Heart: RRR (borderline brady), no M/R/G Abdomen:  firm but pliable, NT, ND, BS present and hypoactive  Ext: Warm and dry.  Right fourth toe amputated; surgical site very well healed. Neurologic:  CN II-XII intact, +5 strength in upper ext and in LLE, +4 strength in LLE.  Sensation diminished in bilateral LEs.   Psych: memory intact for recent and remote, normally interactive, good eye contact, affect as expected  LABS:  WBC                                      5.3               4.0-10.5         K/uL  RBC                                      2.24       l      4.22-5.81        MIL/uL  Hemoglobin (HGB)                         6.9        L      13.0-17.0        g/dL    CRITICAL RESULT CALLED TO, READ BACK BY AND VERIFIED WITH:    Kent Argyle RN 02/05/10 1244 Kent Barnes  Hematocrit (HCT)                         20.7       l      39.0-52.0        %  MCV                                      92.4              78.0-100.0       fL  MCHC                                     33.0              30.0-36.0        g/dL  RDW  16.7       h      11.5-15.5        %  Platelet Count (PLT)                     393               150-400          K/uL  Neutrophils, %                           49                43-77            %  Lymphocytes, %                           37                12-46            %  Monocytes, %                             10                3-12             %  Eosinophils, %                           3                 0-5              %  Basophils, %                             1                 0-1              %  Neutrophils, Absolute                    2.6               1.7-7.7          K/uL  Lymphocytes, Absolute                    2.0               0.7-4.0          K/uL  Monocytes, Absolute                      0.5               0.1-1.0          K/uL  Eosinophils, Absolute                    0.1               0.0-0.7          K/uL   Basophils, Absolute                      0.0  0.0-0.1          K/uL   Protime ( Prothrombin Time)              24.7       h      11.6-15.2        seconds  INR                                      2.25       h      0.00-1.49   Sodium (NA)                              142               135-145          mEq/L  Potassium (K)                            2.9        l      3.5-5.1          mEq/L  Chloride                                 124        h      96-112           mEq/L  CO2                                      16         l      19-32            mEq/L  Glucose                                  100        h      70-99            mg/dL  BUN                                      14                6-23             mg/dL  Creatinine                               3.29       h      0.4-1.5          mg/dL  GFR, Est Non African American            20         l      >60              mL/min  GFR, Est African American  24         l      >60              mL/min    Oversized comment, see footnote  1  Calcium                                  8.4               8.4-10.5         mg/dL   Occult Blood, Fecal                      NEGATIVE   ASSESSMENT AND PLAN: 1. Severe Anemia Unclear etiology at this point. Possible etiologies include - GI Bleeding (Given history of FOBT + during last admission) vs. Anemia from critical illness (Given recent prolonged hospitalization) vs. Anti-coagulation in the setting of unknown bleeding source Plan: Admit to SDU Transfuse 2 units of PRBC Check CBC q12H Call GI consult given the suspicion for GI source is very likely Will hold Coumadin for now with GI recs pending Will consider FFP if GI plans colonoscopy. 2. ACUTE KIDNEY INJURY: Suspect secondary to Pre-renal (dehydration ) vs Infectious (UTI / Pyelonephritis) Plan: Hydrate with IVF Check FeNa, renal USG,  Blood cultures, Urine analysis, Urine cultures Consider Abx therapy if U/A reveals  UTI Monitor renal parameters. 3. HIV Continue HAART Check CD4 count 4. HYPOKALEMIA: Patient repleted KCl in the ER. Will repeat BMP, Mg Monitor electrolytes 5. HYPER-COAGULABLE STATE: INR therapeutic. Hold Coumadin for now pending GI recs, severe anemia 6. HYPERTENSION: SBP's well controlled.  Hold BP meds for now 7. DEPRESSION: Continue home medications. DVT PPX SCD's  Clinical Lists Changes

## 2010-10-01 NOTE — Assessment & Plan Note (Addendum)
Summary: fukam   CC:  follow-up visit.  History of Present Illness: 55 yo M with hx of HIV+ since 1993. First seen in ID clinic 2005 and was previously on ATVr/TRV which he had been started on through the [facility]. Geno negative Jan 05, 2006 (due to detectable virus).   He was lost to f/u for several years and then adm to [facility] 08-15-09 to 10-11-09 with multiple emboli. 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 He then developed upper abd fluid collections (? abscesses). He was not a surgical candidate and he was started on coumadin therapyin the hospital. His course was also complicated by C diff. His CD4 in hospital was 30 and VL was 34,000 (geno K103N). repeated 09-19-09 VL 251, CD4 230. He was started on atripla. NOted to have Genotype 10-29-09 K103N,M184V,P225H Re-adm to hospital 6-7 to 02-15-10 and found to have renal failure, acidosis, anemia (colon negative, EGD Abnormal area in the proximal stomach representing a small mass versus scar tissue as well as submucosal lesion approximately 1 cm diameter in esophagus, probable lipoma. Pathology results revealed chronic active gastritis with ulceration and intestinal metaplasia.  No evidence of dysplasia or malignancy.).  His ART was stopped. Cr 1.37 (02-18-10). He had f/u 03-18-10 and was started on CBV/ISN/DRVr.  For thanksgiving stayed at his sisters. spent the night then returned. his medicines have been going well. no problems that he is aware of. Strength in his legs is "fine, i can walk with a walker". has decreased sensation in his R leg.   Preventive Screening-Counseling & Management  Alcohol-Tobacco     Alcohol drinks/day: 0     Alcohol type: beer     Smoking Status: current     Smoking Cessation Counseling: yes     Packs/Day: <0.25     Year Quit:  2010  Caffeine-Diet-Exercise     Caffeine use/day: coffee sometimes,tea,sodas occassionally     Does Patient Exercise: yes     Type of exercise: PT     Exercise (avg: min/session): >60     Times/week: 5  Hep-HIV-STD-Contraception     HIV Risk: no risk noted     HIV Risk Counseling: not indicated-no HIV risk noted     Sun Exposure-Excessive: rarely  Safety-Violence-Falls     Seat Belt Use: yes  Comments: declined condoms      Sexual History:  n/a.     Updated Prior Medication List: MULTIVITAL  TABS (MULTIPLE VITAMINS-MINERALS) one daily WARFARIN SODIUM 2 MG TABS (WARFARIN SODIUM) Take 1 tablet by mouth once a day ZOLOFT 100 MG TABS (SERTRALINE HCL) Take 1 tab by mouth daily PRAVACHOL 40 MG TABS (PRAVASTATIN SODIUM) Take 1 tablet by mouth at bedtime PRILOSEC 20 MG CPDR (OMEPRAZOLE) Take 1 cap by mouth daily ENABLEX 7.5 MG XR24H-TAB (DARIFENACIN HYDROBROMIDE) Take 1 tablet by mouth once a day MAG-OX 400 400 MG TABS (MAGNESIUM OXIDE) Take 1 tab by mouth two times a day ZOLOFT 50 MG TABS (SERTRALINE HCL) Take 1 tab by mouth daily AMBIEN 5 MG TABS (ZOLPIDEM TARTRATE) Take 1 tab by mouth at bedtime NORVASC 5 MG TABS (AMLODIPINE BESYLATE) take 1 pill by mouth daily. PREZISTA 400 MG TABS (DARUNAVIR ETHANOLATE) 2 tab by mouth once daily NORVIR 100 MG TABS (RITONAVIR) 1 tab by mouth once daily ISENTRESS 400 MG TABS (RALTEGRAVIR POTASSIUM) Take  1 tablet by mouth two times a day COMBIVIR 150-300 MG TABS (LAMIVUDINE-ZIDOVUDINE) Take 1 tablet by mouth two times a day  Current Allergies (reviewed today): ! SEPTRA ! CODEINE ! SULFA Current Medications (verified): 1)  Multivital  Tabs (Multiple Vitamins-Minerals) .... One Daily 2)  Warfarin Sodium 2 Mg Tabs (Warfarin Sodium) .... Take 1 Tablet By Mouth Once A Day 3)  Zoloft 100 Mg Tabs (Sertraline Hcl) .... Take 1 Tab By Mouth Daily 4)  Pravachol 40 Mg Tabs (Pravastatin Sodium) .... Take 1 Tablet By Mouth At Bedtime 5)  Prilosec 20 Mg  Cpdr (Omeprazole) .... Take 1 Cap By Mouth Daily 6)  Enablex 7.5 Mg Xr24h-Tab (Darifenacin Hydrobromide) .... Take 1 Tablet By Mouth Once A Day 7)  Mag-Ox 400 400 Mg Tabs (Magnesium Oxide) .... Take 1 Tab By Mouth Two Times A Day 8)  Zoloft 50 Mg Tabs (Sertraline Hcl) .... Take 1 Tab By Mouth Daily 9)  Ambien 5 Mg Tabs (Zolpidem Tartrate) .... Take 1 Tab By Mouth At Bedtime 10)  Norvasc 5 Mg Tabs (Amlodipine Besylate) .... Take 1 Pill By Mouth Daily. 11)  Prezista 400 Mg Tabs (Darunavir Ethanolate) .... 2 Tab By Mouth Once Daily 12)  Norvir 100 Mg Tabs (Ritonavir) .Marland Kitchen.. 1 Tab By Mouth Once Daily 13)  Isentress 400 Mg Tabs (Raltegravir Potassium) .... Take 1 Tablet By Mouth Two Times A Day 14)  Combivir 150-300 Mg Tabs (Lamivudine-Zidovudine) .... Take 1 Tablet By Mouth Two Times A Day  Allergies (verified): 1)  ! Septra 2)  ! Codeine 3)  ! Sulfa   Social History: Sexual History:  n/a  Review of Systems       wt up 48#, denies LE edema. finnished PT  Vital Signs:  Patient profile:   55 year old male Height:      72 inches (182.88 cm) Weight:      162 pounds Temp:     97.8 degrees F (36.56 degrees C) oral Pulse rate:   83 / minute BP sitting:   144 / 81  (left arm) Cuff size:   large  Vitals Entered By: Jennet Maduro RN (July 30, 2010 3:36 PM) CC: follow-up visit Pain Assessment Patient in pain? no      Nutritional Status BMI of 19 -24 = normal        Medication Adherence: 07/30/2010   Adherence to medications reviewed with patient. Counseling to provide adequate adherence provided   Prevention For Positives: 07/30/2010   Safe sex practices discussed with patient. Condoms offered.                             Physical Exam  General:  well-developed, well-nourished, and well-hydrated.   Eyes:  pupils equal, pupils round, and pupils reactive to light.   Mouth:  no dental plaque, pharynx pink and moist, no exudates, and poor dentition.   Neck:  no  masses.   Lungs:  normal respiratory effort and normal breath sounds.   Heart:  normal rate, regular rhythm, and no murmur.   Abdomen:  soft, non-tender, and normal bowel sounds.     Impression & Recommendations:  Problem # 1:  HIV DISEASE (ICD-042) he is doing very well. will check his labs today. offered condoms. update vax records, Hep A #2 today. return to clinic 4-5 months.   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 asx   Problem # 3:  PARAPLEGIA (ICD-344.1) he appears to have made some progress. will cont to watch.   Other Orders: T-CD4SP [facility]) (CD4SP) T-HIV Viral Load [phone]) T-Comprehensive Metabolic Panel [phone]) T-CBC w/Diff [phone]) Est. Patient Level IV (62952)        Medication Adherence: 07/30/2010   Adherence to medications reviewed with patient. Counseling to provide adequate adherence provided    Prevention For Positives: 07/30/2010   Safe sex practices discussed with patient. Condoms offered.                              Immunization History:  Influenza Immunization History:    Influenza:  historical (07/01/2010)  Pneumovax Immunization History:    Pneumovax:  pneumovax (09/14/2009)  Appended Document: fu + #2 Hep A vaccine   Hepatitis A Vaccine # 2    Vaccine Type: HepA    Site: left deltoid    Mfr: [employer]    Dose: 1.0 ml    Route: IM    Given by: Jennet Maduro RN    Exp. Date: 10/04/2012    Lot #: [id number]

## 2010-10-01 NOTE — Assessment & Plan Note (Signed)
Summary: fukam   CC:  follow-up visit.  History of Present Illness: 55 yo M with hx of HIV+ since 1993. First seen in ID clinic 2005 and was previously on ATVr/TRV which he had been started on through the [facility]. Geno negative Jan 05, 2006 (due to detectable virus).   He was lost to f/u for several years and then adm to [facility] 08-15-09 to 10-11-09 with multiple emboli. 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 He then developed upper abd fluid collections (? abscesses). He was not a surgical candidate and he was started on coumadin therapyin the hospital. His course was also complicated by C diff. His CD4 in hospital was 30 and VL was 34,000 (geno K103N). repeated 09-19-09 VL 251, CD4 230. He was started on atripla. was d/c to SNF. F/u H/H 9.6/28.4 on 2-18. has been getting rehab. food is good at SNF. stomach feels fine.   Preventive Screening-Counseling & Management  Alcohol-Tobacco     Alcohol drinks/day: 0     Smoking Status: quit     Smoking Cessation Counseling: yes     Year Quit: 2010  Caffeine-Diet-Exercise     Caffeine use/day: coffee sometimes,tea,sodas occassionally     Does Patient Exercise: yes     Type of exercise: PT     Exercise (avg: min/session): >60     Times/week: 5  Safety-Violence-Falls     Seat Belt Use: yes   Updated Prior Medication List: DAPSONE 100 MG TABS (DAPSONE) Take 1 tablet by mouth once a day MULTIVITAL  TABS (MULTIPLE VITAMINS-MINERALS) one daily ATRIPLA 600-200-300 MG TABS (EFAVIRENZ-EMTRICITAB-TENOFOVIR) Take 1 tablet by mouth at bedtime WARFARIN SODIUM 2 MG TABS (WARFARIN SODIUM) Take 1 tablet by mouth once a day ZOLOFT 100 MG TABS (SERTRALINE HCL) Take 1 tablet by mouth once a day LEUCOVORIN CALCIUM 5 MG TABS (LEUCOVORIN CALCIUM) 2 tab once daily DIFLUCAN 100 MG TABS  (FLUCONAZOLE) Take 1 tablet by mouth once a day PRAVACHOL 40 MG TABS (PRAVASTATIN SODIUM) Take 1 tablet by mouth at bedtime DARAPRIM 25 MG TABS (PYRIMETHAMINE) 3 tab by mouth qweek PROTONIX 40 MG TBEC (PANTOPRAZOLE SODIUM) Take 1 tablet by mouth once a day NICODERM CQ 7 MG/24HR PT24 (NICOTINE) once daily METOPROLOL TARTRATE 25 MG TABS (METOPROLOL TARTRATE) 1 tab two times a day ENABLEX 7.5 MG XR24H-TAB (DARIFENACIN HYDROBROMIDE) Take 1 tablet by mouth once a day  Current Allergies (reviewed today): ! SEPTRA ! CODEINE Past History:  Past medical, surgical, family and social histories (including risk factors) reviewed, and no changes noted (except as noted below).  Past Medical History: Reviewed history from 08/11/2008 and no changes required. GERD HIV disease Hypertension Transient ischemic attack, hx of Nephrolithiasis, hx of Tobacco Abuse  Family History: Reviewed history from 09/20/2008 and no changes required. Family History Hypertension (mother, also with dementia)  Social History:  Alcohol use-yes, previously occas.  risk MSM Former Smoker  Review of Systems       eating well, no pain.   Vital Signs:  Patient profile:   55 year old male Height:      72 inches (182.88 cm) Temp:     96.4 degrees F (35.78 degrees C) oral Pulse rate:   94 / minute BP sitting:   112 / 73  (left arm)  Vitals Entered By: Baxter Hire) (October 24, 2009 11:04 AM) CC: follow-up visit Pain Assessment Patient in pain? no      Nutritional Status Detail appetite is fine per patient  Have you ever been in a relationship where you felt threatened, hurt or afraid?No   Does patient need assistance? Functional Status Self care Ambulation Normal   Physical Exam  General:  underweight appearing.   Eyes:  pupils equal, pupils round, and pupils reactive to light.   Mouth:  pharynx pink and moist, no exudates, and poor dentition.   Neck:  no masses.   Lungs:  normal  respiratory effort and normal breath sounds.   Heart:  normal rate, regular rhythm, and no murmur.   Abdomen:  soft, non-tender, and normal bowel sounds.   Extremities:  infarcted R 4th toe- some viable tissue seen under scab proximally. non-tender. no d/c.         Medication Adherence: 10/24/2009   Adherence to medications reviewed with patient. Counseling to provide adequate adherence provided                                Impression & Recommendations:  Problem # 1:  HIV DISEASE (ICD-042) he is doing well so far on Atripla despite his K103N. will change his ART and recheck his labs. will stop his OI meds given his CD4 was > 200 at d/c.I do not believe he is sexually active.  will need 2nd hep A at f/u. return to clinic 3 months   Problem # 2:  PARAPLEGIA (ICD-344.1) he will cont at snf where he is getting good care, nutrition, rehab.  has surgical f/u for his foot and abd. f/u CT scan is planned.   Medications Added to Medication List This Visit: 1)  Warfarin Sodium 2 Mg Tabs (Warfarin sodium) .... Take 1 tablet by mouth once a day 2)  Zoloft 100 Mg Tabs (Sertraline hcl) .... Take 1 tablet by mouth once a day 3)  Pravachol 40 Mg Tabs (Pravastatin sodium) .... Take 1 tablet by mouth at bedtime 4)  Protonix 40 Mg Tbec (Pantoprazole sodium) .... Take 1 tablet by mouth once a day 5)  Nicoderm Cq 7 Mg/24hr Pt24 (Nicotine) .... Once daily 6)  Metoprolol Tartrate 25 Mg Tabs (Metoprolol tartrate) .Marland Kitchen.. 1 tab two times a day 7)  Enablex 7.5 Mg Xr24h-tab (Darifenacin hydrobromide) .... Take 1 tablet by mouth once a day 8)  Truvada 200-300 Mg Tabs (Emtricitabine-tenofovir) .... Take 1 tablet by mouth once a day 9)  Norvir 100 Mg Tabs (Ritonavir) .... Take 1 tablet by mouth once a day 10)  Prezista 400 Mg Tabs (Darunavir ethanolate) .... 2 tab by mouth once daily  Other Orders: T-CD4SP [facility]) (CD4SP) T-HIV Viral Load [id number]) T-Comprehensive Metabolic Panel  ([id number]) T-CBC w/Diff [id number]) T-RPR (Syphilis) ([id number]) Est. Patient Level IV (24401)  Prescriptions: NORVIR 100 MG TABS (RITONAVIR) Take 1 tablet by mouth once a day  #90 x 3   Entered and Authorized by:   Johny Sax MD   Signed by:   Johny Sax MD on 10/24/2009   Method used:   Print then Give to Patient   RxID:   [id number] TRUVADA 200-300 MG TABS (EMTRICITABINE-TENOFOVIR) Take 1 tablet by mouth once a day  #90 x 3   Entered and Authorized by:   Johny Sax MD   Signed by:   Johny Sax MD on 10/24/2009   Method used:   Print then Give to  Patient   RxID:   [id number]  Process Orders Check Orders Results:     [facility]: ABN not required for this insurance Tests Sent for requisitioning (October 24, 2009 12:01 PM):     10/24/2009: [facility] -- T-HIV Viral Load [phone] (signed)     10/24/2009: [facility] -- T-Comprehensive Metabolic Panel [[id number]] (signed)     10/24/2009: [facility] -- T-CBC w/Diff [[id number]] (signed)     10/24/2009: [facility] -- T-RPR (Syphilis) [phone] (signed)

## 2010-10-23 NOTE — Consult Note (Signed)
Summary: [facility]   Imported By: Florinda Marker 10/14/2010 18:06:34  _____________________________________________________________________  External Attachment:    Type:   Image     Comment:   External Document

## 2010-11-17 LAB — BASIC METABOLIC PANEL
BUN: 10 mg/dL (ref 6–23)
BUN: 5 mg/dL — ABNORMAL LOW (ref 6–23)
BUN: 5 mg/dL — ABNORMAL LOW (ref 6–23)
BUN: 5 mg/dL — ABNORMAL LOW (ref 6–23)
BUN: 5 mg/dL — ABNORMAL LOW (ref 6–23)
BUN: 5 mg/dL — ABNORMAL LOW (ref 6–23)
BUN: 6 mg/dL (ref 6–23)
BUN: 6 mg/dL (ref 6–23)
BUN: 6 mg/dL (ref 6–23)
BUN: 7 mg/dL (ref 6–23)
BUN: 7 mg/dL (ref 6–23)
BUN: 8 mg/dL (ref 6–23)
BUN: 11 mg/dL (ref 6–23)
BUN: 11 mg/dL (ref 6–23)
BUN: 14 mg/dL (ref 6–23)
BUN: 15 mg/dL (ref 6–23)
BUN: 17 mg/dL (ref 6–23)
BUN: 19 mg/dL (ref 6–23)
BUN: 8 mg/dL (ref 6–23)
BUN: 4 mg/dL — ABNORMAL LOW (ref 6–23)
BUN: 5 mg/dL — ABNORMAL LOW (ref 6–23)
BUN: 5 mg/dL — ABNORMAL LOW (ref 6–23)
CO2: 18 mEq/L — ABNORMAL LOW (ref 19–32)
CO2: 19 mEq/L (ref 19–32)
CO2: 19 mEq/L (ref 19–32)
CO2: 20 mEq/L (ref 19–32)
CO2: 21 mEq/L (ref 19–32)
CO2: 21 mEq/L (ref 19–32)
CO2: 21 mEq/L (ref 19–32)
CO2: 21 mEq/L (ref 19–32)
CO2: 22 mEq/L (ref 19–32)
CO2: 18 mEq/L — ABNORMAL LOW (ref 19–32)
CO2: 19 mEq/L (ref 19–32)
CO2: 23 mEq/L (ref 19–32)
CO2: 23 mEq/L (ref 19–32)
CO2: 17 mEq/L — ABNORMAL LOW (ref 19–32)
CO2: 18 mEq/L — ABNORMAL LOW (ref 19–32)
CO2: 22 mEq/L (ref 19–32)
Calcium: 7.8 mg/dL — ABNORMAL LOW (ref 8.4–10.5)
Calcium: 8 mg/dL — ABNORMAL LOW (ref 8.4–10.5)
Calcium: 8 mg/dL — ABNORMAL LOW (ref 8.4–10.5)
Calcium: 8.1 mg/dL — ABNORMAL LOW (ref 8.4–10.5)
Calcium: 8.1 mg/dL — ABNORMAL LOW (ref 8.4–10.5)
Calcium: 8.1 mg/dL — ABNORMAL LOW (ref 8.4–10.5)
Calcium: 8.3 mg/dL — ABNORMAL LOW (ref 8.4–10.5)
Calcium: 8.3 mg/dL — ABNORMAL LOW (ref 8.4–10.5)
Calcium: 8.3 mg/dL — ABNORMAL LOW (ref 8.4–10.5)
Calcium: 8.4 mg/dL (ref 8.4–10.5)
Calcium: 7.8 mg/dL — ABNORMAL LOW (ref 8.4–10.5)
Calcium: 8.1 mg/dL — ABNORMAL LOW (ref 8.4–10.5)
Calcium: 8.3 mg/dL — ABNORMAL LOW (ref 8.4–10.5)
Chloride: 106 mEq/L (ref 96–112)
Chloride: 109 mEq/L (ref 96–112)
Chloride: 110 mEq/L (ref 96–112)
Chloride: 110 mEq/L (ref 96–112)
Chloride: 110 mEq/L (ref 96–112)
Chloride: 111 mEq/L (ref 96–112)
Chloride: 113 mEq/L — ABNORMAL HIGH (ref 96–112)
Chloride: 113 mEq/L — ABNORMAL HIGH (ref 96–112)
Chloride: 101 mEq/L (ref 96–112)
Chloride: 104 mEq/L (ref 96–112)
Chloride: 105 mEq/L (ref 96–112)
Chloride: 106 mEq/L (ref 96–112)
Chloride: 106 mEq/L (ref 96–112)
Chloride: 111 mEq/L (ref 96–112)
Chloride: 111 mEq/L (ref 96–112)
Chloride: 114 mEq/L — ABNORMAL HIGH (ref 96–112)
Chloride: 115 mEq/L — ABNORMAL HIGH (ref 96–112)
Creatinine, Ser: 0.87 mg/dL (ref 0.4–1.5)
Creatinine, Ser: 0.98 mg/dL (ref 0.4–1.5)
Creatinine, Ser: 0.99 mg/dL (ref 0.4–1.5)
Creatinine, Ser: 1 mg/dL (ref 0.4–1.5)
Creatinine, Ser: 1 mg/dL (ref 0.4–1.5)
Creatinine, Ser: 1.02 mg/dL (ref 0.4–1.5)
Creatinine, Ser: 1.11 mg/dL (ref 0.4–1.5)
Creatinine, Ser: 1.14 mg/dL (ref 0.4–1.5)
Creatinine, Ser: 1.15 mg/dL (ref 0.4–1.5)
Creatinine, Ser: 0.97 mg/dL (ref 0.4–1.5)
Creatinine, Ser: 0.97 mg/dL (ref 0.4–1.5)
Creatinine, Ser: 1.03 mg/dL (ref 0.4–1.5)
Creatinine, Ser: 1.04 mg/dL (ref 0.4–1.5)
Creatinine, Ser: 1.21 mg/dL (ref 0.4–1.5)
Creatinine, Ser: 1.24 mg/dL (ref 0.4–1.5)
Creatinine, Ser: 1.25 mg/dL (ref 0.4–1.5)
Creatinine, Ser: 1.54 mg/dL — ABNORMAL HIGH (ref 0.4–1.5)
Creatinine, Ser: 1.27 mg/dL (ref 0.4–1.5)
Creatinine, Ser: 1.33 mg/dL (ref 0.4–1.5)
GFR calc Af Amer: >60 mL/min (ref >60)
GFR calc Af Amer: >60 mL/min (ref >60)
GFR calc Af Amer: >60 mL/min (ref >60)
GFR calc Af Amer: >60 mL/min (ref >60)
GFR calc Af Amer: >60 mL/min (ref >60)
GFR calc Af Amer: >60 mL/min (ref >60)
GFR calc Af Amer: >60 mL/min (ref >60)
GFR calc Af Amer: >60 mL/min (ref >60)
GFR calc Af Amer: >60 mL/min (ref >60)
GFR calc Af Amer: >60 mL/min (ref >60)
GFR calc Af Amer: >60 mL/min (ref >60)
GFR calc Af Amer: >60 mL/min (ref >60)
GFR calc Af Amer: >60 mL/min (ref >60)
GFR calc non Af Amer: >60 mL/min (ref >60)
GFR calc non Af Amer: >60 mL/min (ref >60)
GFR calc non Af Amer: >60 mL/min (ref >60)
GFR calc non Af Amer: >60 mL/min (ref >60)
GFR calc non Af Amer: >60 mL/min (ref >60)
GFR calc non Af Amer: >60 mL/min (ref >60)
GFR calc non Af Amer: >60 mL/min (ref >60)
GFR calc non Af Amer: >60 mL/min (ref >60)
GFR calc non Af Amer: >60 mL/min (ref >60)
GFR calc non Af Amer: >60 mL/min (ref >60)
GFR calc non Af Amer: >60 mL/min (ref >60)
GFR calc non Af Amer: >60 mL/min (ref >60)
GFR calc non Af Amer: >60 mL/min (ref >60)
GFR calc non Af Amer: 58 mL/min — ABNORMAL LOW (ref >60)
Glucose, Bld: 114 mg/dL — ABNORMAL HIGH (ref 70–99)
Glucose, Bld: 123 mg/dL — ABNORMAL HIGH (ref 70–99)
Glucose, Bld: 135 mg/dL — ABNORMAL HIGH (ref 70–99)
Glucose, Bld: 138 mg/dL — ABNORMAL HIGH (ref 70–99)
Glucose, Bld: 92 mg/dL (ref 70–99)
Glucose, Bld: 95 mg/dL (ref 70–99)
Glucose, Bld: 96 mg/dL (ref 70–99)
Glucose, Bld: 96 mg/dL (ref 70–99)
Glucose, Bld: 98 mg/dL (ref 70–99)
Glucose, Bld: 101 mg/dL — ABNORMAL HIGH (ref 70–99)
Glucose, Bld: 112 mg/dL — ABNORMAL HIGH (ref 70–99)
Glucose, Bld: 120 mg/dL — ABNORMAL HIGH (ref 70–99)
Glucose, Bld: 124 mg/dL — ABNORMAL HIGH (ref 70–99)
Glucose, Bld: 146 mg/dL — ABNORMAL HIGH (ref 70–99)
Glucose, Bld: 98 mg/dL (ref 70–99)
Glucose, Bld: 77 mg/dL (ref 70–99)
Glucose, Bld: 77 mg/dL (ref 70–99)
Glucose, Bld: 95 mg/dL (ref 70–99)
Potassium: 3.2 mEq/L — ABNORMAL LOW (ref 3.5–5.1)
Potassium: 3.6 mEq/L (ref 3.5–5.1)
Potassium: 3.6 mEq/L (ref 3.5–5.1)
Potassium: 3.7 mEq/L (ref 3.5–5.1)
Potassium: 3.7 mEq/L (ref 3.5–5.1)
Potassium: 3.8 mEq/L (ref 3.5–5.1)
Potassium: 4 mEq/L (ref 3.5–5.1)
Potassium: 4 mEq/L (ref 3.5–5.1)
Potassium: 3.2 mEq/L — ABNORMAL LOW (ref 3.5–5.1)
Potassium: 3.4 mEq/L — ABNORMAL LOW (ref 3.5–5.1)
Potassium: 4 mEq/L (ref 3.5–5.1)
Potassium: 4.6 mEq/L (ref 3.5–5.1)
Potassium: 3.9 mEq/L (ref 3.5–5.1)
Potassium: 4.9 mEq/L (ref 3.5–5.1)
Sodium: 133 mEq/L — ABNORMAL LOW (ref 135–145)
Sodium: 134 mEq/L — ABNORMAL LOW (ref 135–145)
Sodium: 135 mEq/L (ref 135–145)
Sodium: 135 mEq/L (ref 135–145)
Sodium: 137 mEq/L (ref 135–145)
Sodium: 137 mEq/L (ref 135–145)
Sodium: 138 mEq/L (ref 135–145)
Sodium: 138 mEq/L (ref 135–145)
Sodium: 139 mEq/L (ref 135–145)
Sodium: 131 mEq/L — ABNORMAL LOW (ref 135–145)
Sodium: 134 mEq/L — ABNORMAL LOW (ref 135–145)
Sodium: 137 mEq/L (ref 135–145)

## 2010-11-17 LAB — COMPREHENSIVE METABOLIC PANEL
ALT: 18 U/L (ref 0–53)
ALT: 19 U/L (ref 0–53)
ALT: 21 U/L (ref 0–53)
ALT: 23 U/L (ref 0–53)
AST: 17 U/L (ref 0–37)
AST: 23 U/L (ref 0–37)
AST: 35 U/L (ref 0–37)
AST: 29 U/L (ref 0–37)
Albumin: 2 g/dL — ABNORMAL LOW (ref 3.5–5.2)
Albumin: 2 g/dL — ABNORMAL LOW (ref 3.5–5.2)
Albumin: 2.4 g/dL — ABNORMAL LOW (ref 3.5–5.2)
Alkaline Phosphatase: 122 U/L — ABNORMAL HIGH (ref 39–117)
Alkaline Phosphatase: 139 U/L — ABNORMAL HIGH (ref 39–117)
Alkaline Phosphatase: 158 U/L — ABNORMAL HIGH (ref 39–117)
BUN: 12 mg/dL (ref 6–23)
BUN: 17 mg/dL (ref 6–23)
CO2: 16 mEq/L — ABNORMAL LOW (ref 19–32)
CO2: 29 mEq/L (ref 19–32)
CO2: 20 mEq/L (ref 19–32)
Calcium: 8 mg/dL — ABNORMAL LOW (ref 8.4–10.5)
Calcium: 8.3 mg/dL — ABNORMAL LOW (ref 8.4–10.5)
Calcium: 8.1 mg/dL — ABNORMAL LOW (ref 8.4–10.5)
Chloride: 104 mEq/L (ref 96–112)
Chloride: 110 mEq/L (ref 96–112)
Chloride: 113 mEq/L — ABNORMAL HIGH (ref 96–112)
Chloride: 115 mEq/L — ABNORMAL HIGH (ref 96–112)
Chloride: 117 mEq/L — ABNORMAL HIGH (ref 96–112)
Creatinine, Ser: 0.99 mg/dL (ref 0.4–1.5)
Creatinine, Ser: 0.99 mg/dL (ref 0.4–1.5)
Creatinine, Ser: 1.17 mg/dL (ref 0.4–1.5)
GFR calc Af Amer: >60 mL/min (ref >60)
GFR calc Af Amer: >60 mL/min (ref >60)
GFR calc Af Amer: >60 mL/min (ref >60)
GFR calc Af Amer: >60 mL/min (ref >60)
GFR calc non Af Amer: 59 mL/min — ABNORMAL LOW (ref >60)
GFR calc non Af Amer: >60 mL/min (ref >60)
GFR calc non Af Amer: >60 mL/min (ref >60)
Glucose, Bld: 129 mg/dL — ABNORMAL HIGH (ref 70–99)
Glucose, Bld: 144 mg/dL — ABNORMAL HIGH (ref 70–99)
Glucose, Bld: 94 mg/dL (ref 70–99)
Potassium: 3.1 mEq/L — ABNORMAL LOW (ref 3.5–5.1)
Potassium: 3.3 mEq/L — ABNORMAL LOW (ref 3.5–5.1)
Potassium: 3.8 mEq/L (ref 3.5–5.1)
Potassium: 3.8 mEq/L (ref 3.5–5.1)
Potassium: 4.3 mEq/L (ref 3.5–5.1)
Sodium: 132 mEq/L — ABNORMAL LOW (ref 135–145)
Sodium: 135 mEq/L (ref 135–145)
Sodium: 138 mEq/L (ref 135–145)
Sodium: 139 mEq/L (ref 135–145)
Sodium: 140 mEq/L (ref 135–145)
Total Bilirubin: 0.5 mg/dL (ref 0.3–1.2)
Total Bilirubin: 0.7 mg/dL (ref 0.3–1.2)
Total Bilirubin: 0.7 mg/dL (ref 0.3–1.2)
Total Bilirubin: 0.8 mg/dL (ref 0.3–1.2)
Total Protein: 5.9 g/dL — ABNORMAL LOW (ref 6.0–8.3)
Total Protein: 6.1 g/dL (ref 6.0–8.3)

## 2010-11-17 LAB — CBC
HCT: 21.6 % — ABNORMAL LOW (ref 39.0–52.0)
HCT: 23.9 % — ABNORMAL LOW (ref 39.0–52.0)
HCT: 25.6 % — ABNORMAL LOW (ref 39.0–52.0)
HCT: 25.9 % — ABNORMAL LOW (ref 39.0–52.0)
HCT: 26.2 % — ABNORMAL LOW (ref 39.0–52.0)
HCT: 27.5 % — ABNORMAL LOW (ref 39.0–52.0)
HCT: 21.7 % — ABNORMAL LOW (ref 39.0–52.0)
HCT: 22.9 % — ABNORMAL LOW (ref 39.0–52.0)
HCT: 25.1 % — ABNORMAL LOW (ref 39.0–52.0)
HCT: 25.4 % — ABNORMAL LOW (ref 39.0–52.0)
HCT: 26.3 % — ABNORMAL LOW (ref 39.0–52.0)
HCT: 26.4 % — ABNORMAL LOW (ref 39.0–52.0)
HCT: 25.3 % — ABNORMAL LOW (ref 39.0–52.0)
Hemoglobin: 7.2 g/dL — ABNORMAL LOW (ref 13.0–17.0)
Hemoglobin: 8.1 g/dL — ABNORMAL LOW (ref 13.0–17.0)
Hemoglobin: 8.6 g/dL — ABNORMAL LOW (ref 13.0–17.0)
Hemoglobin: 8.8 g/dL — ABNORMAL LOW (ref 13.0–17.0)
Hemoglobin: 9 g/dL — ABNORMAL LOW (ref 13.0–17.0)
Hemoglobin: 7.3 g/dL — ABNORMAL LOW (ref 13.0–17.0)
Hemoglobin: 7.4 g/dL — ABNORMAL LOW (ref 13.0–17.0)
Hemoglobin: 7.4 g/dL — ABNORMAL LOW (ref 13.0–17.0)
Hemoglobin: 7.8 g/dL — ABNORMAL LOW (ref 13.0–17.0)
Hemoglobin: 8.2 g/dL — ABNORMAL LOW (ref 13.0–17.0)
Hemoglobin: 8.7 g/dL — ABNORMAL LOW (ref 13.0–17.0)
Hemoglobin: 8.9 g/dL — ABNORMAL LOW (ref 13.0–17.0)
MCHC: 33 g/dL (ref 30.0–36.0)
MCHC: 33.1 g/dL (ref 30.0–36.0)
MCHC: 33.2 g/dL (ref 30.0–36.0)
MCHC: 33.2 g/dL (ref 30.0–36.0)
MCHC: 33.3 g/dL (ref 30.0–36.0)
MCHC: 33.6 g/dL (ref 30.0–36.0)
MCHC: 33.2 g/dL (ref 30.0–36.0)
MCHC: 33.2 g/dL (ref 30.0–36.0)
MCHC: 33.8 g/dL (ref 30.0–36.0)
MCHC: 33.8 g/dL (ref 30.0–36.0)
MCHC: 34.1 g/dL (ref 30.0–36.0)
MCHC: 34.5 g/dL (ref 30.0–36.0)
MCHC: 32.9 g/dL (ref 30.0–36.0)
MCV: 85.3 fL (ref 78.0–100.0)
MCV: 85.5 fL (ref 78.0–100.0)
MCV: 85.6 fL (ref 78.0–100.0)
MCV: 86.6 fL (ref 78.0–100.0)
MCV: 87.4 fL (ref 78.0–100.0)
MCV: 87.9 fL (ref 78.0–100.0)
MCV: 88.3 fL (ref 78.0–100.0)
MCV: 85.6 fL (ref 78.0–100.0)
MCV: 86.1 fL (ref 78.0–100.0)
MCV: 86.6 fL (ref 78.0–100.0)
MCV: 86.8 fL (ref 78.0–100.0)
MCV: 87.6 fL (ref 78.0–100.0)
MCV: 88.2 fL (ref 78.0–100.0)
MCV: 89.8 fL (ref 78.0–100.0)
MCV: 90.5 fL (ref 78.0–100.0)
MCV: 89.7 fL (ref 78.0–100.0)
MCV: 90 fL (ref 78.0–100.0)
MCV: 90.3 fL (ref 78.0–100.0)
Platelets: 461 10*3/uL — ABNORMAL HIGH (ref 150–400)
Platelets: 470 10*3/uL — ABNORMAL HIGH (ref 150–400)
Platelets: 483 10*3/uL — ABNORMAL HIGH (ref 150–400)
Platelets: 484 10*3/uL — ABNORMAL HIGH (ref 150–400)
Platelets: 490 10*3/uL — ABNORMAL HIGH (ref 150–400)
Platelets: 500 10*3/uL — ABNORMAL HIGH (ref 150–400)
Platelets: 548 10*3/uL — ABNORMAL HIGH (ref 150–400)
Platelets: 511 10*3/uL — ABNORMAL HIGH (ref 150–400)
Platelets: 626 10*3/uL — ABNORMAL HIGH (ref 150–400)
Platelets: 628 10*3/uL — ABNORMAL HIGH (ref 150–400)
Platelets: 650 10*3/uL — ABNORMAL HIGH (ref 150–400)
Platelets: 669 10*3/uL — ABNORMAL HIGH (ref 150–400)
Platelets: 670 10*3/uL — ABNORMAL HIGH (ref 150–400)
Platelets: 685 10*3/uL — ABNORMAL HIGH (ref 150–400)
Platelets: 686 10*3/uL — ABNORMAL HIGH (ref 150–400)
Platelets: 698 10*3/uL — ABNORMAL HIGH (ref 150–400)
Platelets: 702 10*3/uL — ABNORMAL HIGH (ref 150–400)
Platelets: 702 10*3/uL — ABNORMAL HIGH (ref 150–400)
Platelets: 719 10*3/uL — ABNORMAL HIGH (ref 150–400)
Platelets: 222 10*3/uL (ref 150–400)
Platelets: 250 10*3/uL (ref 150–400)
RBC: 2.93 MIL/uL — ABNORMAL LOW (ref 4.22–5.81)
RBC: 2.99 MIL/uL — ABNORMAL LOW (ref 4.22–5.81)
RBC: 3.03 MIL/uL — ABNORMAL LOW (ref 4.22–5.81)
RBC: 3.06 MIL/uL — ABNORMAL LOW (ref 4.22–5.81)
RBC: 3.11 MIL/uL — ABNORMAL LOW (ref 4.22–5.81)
RBC: 2.43 MIL/uL — ABNORMAL LOW (ref 4.22–5.81)
RBC: 2.54 MIL/uL — ABNORMAL LOW (ref 4.22–5.81)
RBC: 2.79 MIL/uL — ABNORMAL LOW (ref 4.22–5.81)
RBC: 2.94 MIL/uL — ABNORMAL LOW (ref 4.22–5.81)
RBC: 2.82 MIL/uL — ABNORMAL LOW (ref 4.22–5.81)
RBC: 2.9 MIL/uL — ABNORMAL LOW (ref 4.22–5.81)
RDW: 16.8 % — ABNORMAL HIGH (ref 11.5–15.5)
RDW: 16.9 % — ABNORMAL HIGH (ref 11.5–15.5)
RDW: 17 % — ABNORMAL HIGH (ref 11.5–15.5)
RDW: 17.1 % — ABNORMAL HIGH (ref 11.5–15.5)
RDW: 17.4 % — ABNORMAL HIGH (ref 11.5–15.5)
RDW: 17.6 % — ABNORMAL HIGH (ref 11.5–15.5)
RDW: 17.9 % — ABNORMAL HIGH (ref 11.5–15.5)
RDW: 15.8 % — ABNORMAL HIGH (ref 11.5–15.5)
RDW: 16.4 % — ABNORMAL HIGH (ref 11.5–15.5)
RDW: 16.5 % — ABNORMAL HIGH (ref 11.5–15.5)
RDW: 16.6 % — ABNORMAL HIGH (ref 11.5–15.5)
RDW: 16.6 % — ABNORMAL HIGH (ref 11.5–15.5)
RDW: 17.1 % — ABNORMAL HIGH (ref 11.5–15.5)
RDW: 17.1 % — ABNORMAL HIGH (ref 11.5–15.5)
RDW: 17.4 % — ABNORMAL HIGH (ref 11.5–15.5)
RDW: 17.6 % — ABNORMAL HIGH (ref 11.5–15.5)
RDW: 17.8 % — ABNORMAL HIGH (ref 11.5–15.5)
RDW: 18.8 % — ABNORMAL HIGH (ref 11.5–15.5)
RDW: 18.5 % — ABNORMAL HIGH (ref 11.5–15.5)
RDW: 18.8 % — ABNORMAL HIGH (ref 11.5–15.5)
WBC: 6.9 10*3/uL (ref 4.0–10.5)
WBC: 7.4 10*3/uL (ref 4.0–10.5)
WBC: 8 10*3/uL (ref 4.0–10.5)
WBC: 8.1 10*3/uL (ref 4.0–10.5)
WBC: 8.2 10*3/uL (ref 4.0–10.5)
WBC: 11.9 10*3/uL — ABNORMAL HIGH (ref 4.0–10.5)
WBC: 13.7 10*3/uL — ABNORMAL HIGH (ref 4.0–10.5)
WBC: 14.2 10*3/uL — ABNORMAL HIGH (ref 4.0–10.5)
WBC: 14.8 10*3/uL — ABNORMAL HIGH (ref 4.0–10.5)
WBC: 14.9 10*3/uL — ABNORMAL HIGH (ref 4.0–10.5)
WBC: 17.6 10*3/uL — ABNORMAL HIGH (ref 4.0–10.5)
WBC: 20.8 10*3/uL — ABNORMAL HIGH (ref 4.0–10.5)
WBC: 29.1 10*3/uL — ABNORMAL HIGH (ref 4.0–10.5)
WBC: 32.1 10*3/uL — ABNORMAL HIGH (ref 4.0–10.5)
WBC: 4.8 10*3/uL (ref 4.0–10.5)
WBC: 5 10*3/uL (ref 4.0–10.5)

## 2010-11-17 LAB — CROSSMATCH
ABO/RH(D): O POS
ABO/RH(D): O POS
ABO/RH(D): O POS
Antibody Screen: NEGATIVE

## 2010-11-17 LAB — PROTIME-INR
INR: 1.33 (ref 0.00–1.49)
INR: 1.33 (ref 0.00–1.49)
INR: 1.35 (ref 0.00–1.49)
INR: 1.37 (ref 0.00–1.49)
INR: 1.5 — ABNORMAL HIGH (ref 0.00–1.49)
INR: 1.54 — ABNORMAL HIGH (ref 0.00–1.49)
INR: 4.33 — ABNORMAL HIGH (ref 0.00–1.49)
INR: 4.48 — ABNORMAL HIGH (ref 0.00–1.49)
INR: 4.95 — ABNORMAL HIGH (ref 0.00–1.49)
INR: 1.13 (ref 0.00–1.49)
INR: 1.25 (ref 0.00–1.49)
INR: 1.25 (ref 0.00–1.49)
INR: 1.28 (ref 0.00–1.49)
INR: 1.41 (ref 0.00–1.49)
INR: 1.66 — ABNORMAL HIGH (ref 0.00–1.49)
INR: 1.72 — ABNORMAL HIGH (ref 0.00–1.49)
INR: 1.98 — ABNORMAL HIGH (ref 0.00–1.49)
Prothrombin Time: 16.4 seconds — ABNORMAL HIGH (ref 11.6–15.2)
Prothrombin Time: 16.6 seconds — ABNORMAL HIGH (ref 11.6–15.2)
Prothrombin Time: 16.8 seconds — ABNORMAL HIGH (ref 11.6–15.2)
Prothrombin Time: 33.2 seconds — ABNORMAL HIGH (ref 11.6–15.2)
Prothrombin Time: 36.3 seconds — ABNORMAL HIGH (ref 11.6–15.2)
Prothrombin Time: 41.1 seconds — ABNORMAL HIGH (ref 11.6–15.2)
Prothrombin Time: 41.2 seconds — ABNORMAL HIGH (ref 11.6–15.2)
Prothrombin Time: 42.3 seconds — ABNORMAL HIGH (ref 11.6–15.2)
Prothrombin Time: 45.7 seconds — ABNORMAL HIGH (ref 11.6–15.2)
Prothrombin Time: 15.2 seconds (ref 11.6–15.2)
Prothrombin Time: 15.9 seconds — ABNORMAL HIGH (ref 11.6–15.2)
Prothrombin Time: 19.5 seconds — ABNORMAL HIGH (ref 11.6–15.2)
Prothrombin Time: 22.3 seconds — ABNORMAL HIGH (ref 11.6–15.2)
Prothrombin Time: 26.6 seconds — ABNORMAL HIGH (ref 11.6–15.2)
Prothrombin Time: 40.3 seconds — ABNORMAL HIGH (ref 11.6–15.2)
Prothrombin Time: 14.7 seconds (ref 11.6–15.2)

## 2010-11-17 LAB — CLOSTRIDIUM DIFFICILE EIA: C difficile Toxins A+B, EIA: 22

## 2010-11-17 LAB — MAGNESIUM
Magnesium: 1.6 mg/dL (ref 1.5–2.5)
Magnesium: 2 mg/dL (ref 1.5–2.5)
Magnesium: 1.9 mg/dL (ref 1.5–2.5)
Magnesium: 1.9 mg/dL (ref 1.5–2.5)

## 2010-11-17 LAB — DIFFERENTIAL
Basophils Absolute: 0 10*3/uL (ref 0.0–0.1)
Basophils Relative: 0 % (ref 0–1)
Basophils Relative: 0 % (ref 0–1)
Eosinophils Absolute: 0.4 10*3/uL (ref 0.0–0.7)
Eosinophils Absolute: 0.8 10*3/uL — ABNORMAL HIGH (ref 0.0–0.7)
Eosinophils Relative: 4 % (ref 0–5)
Lymphs Abs: 3.3 10*3/uL (ref 0.7–4.0)
Lymphs Abs: 3.9 10*3/uL (ref 0.7–4.0)
Neutro Abs: 30.1 10*3/uL — ABNORMAL HIGH (ref 1.7–7.7)
Neutrophils Relative %: 68 % (ref 43–77)

## 2010-11-17 LAB — HEMOGLOBIN AND HEMATOCRIT, BLOOD
HCT: 22.6 % — ABNORMAL LOW (ref 39.0–52.0)
HCT: 25 % — ABNORMAL LOW (ref 39.0–52.0)
HCT: 26.3 % — ABNORMAL LOW (ref 39.0–52.0)
HCT: 23 % — ABNORMAL LOW (ref 39.0–52.0)
HCT: 24 % — ABNORMAL LOW (ref 39.0–52.0)
Hemoglobin: 7.6 g/dL — ABNORMAL LOW (ref 13.0–17.0)
Hemoglobin: 8.3 g/dL — ABNORMAL LOW (ref 13.0–17.0)
Hemoglobin: 8.9 g/dL — ABNORMAL LOW (ref 13.0–17.0)
Hemoglobin: 7.6 g/dL — ABNORMAL LOW (ref 13.0–17.0)
Hemoglobin: 7.8 g/dL — ABNORMAL LOW (ref 13.0–17.0)
Hemoglobin: 7.9 g/dL — ABNORMAL LOW (ref 13.0–17.0)

## 2010-11-17 LAB — GLUCOSE, CAPILLARY
Glucose-Capillary: 133 mg/dL — ABNORMAL HIGH (ref 70–99)
Glucose-Capillary: 133 mg/dL — ABNORMAL HIGH (ref 70–99)
Glucose-Capillary: 134 mg/dL — ABNORMAL HIGH (ref 70–99)
Glucose-Capillary: 135 mg/dL — ABNORMAL HIGH (ref 70–99)
Glucose-Capillary: 142 mg/dL — ABNORMAL HIGH (ref 70–99)
Glucose-Capillary: 144 mg/dL — ABNORMAL HIGH (ref 70–99)
Glucose-Capillary: 147 mg/dL — ABNORMAL HIGH (ref 70–99)
Glucose-Capillary: 148 mg/dL — ABNORMAL HIGH (ref 70–99)
Glucose-Capillary: 151 mg/dL — ABNORMAL HIGH (ref 70–99)
Glucose-Capillary: 153 mg/dL — ABNORMAL HIGH (ref 70–99)
Glucose-Capillary: 153 mg/dL — ABNORMAL HIGH (ref 70–99)
Glucose-Capillary: 156 mg/dL — ABNORMAL HIGH (ref 70–99)
Glucose-Capillary: 182 mg/dL — ABNORMAL HIGH (ref 70–99)

## 2010-11-17 LAB — URINE MICROSCOPIC-ADD ON

## 2010-11-17 LAB — HEMATOCRIT: HCT: 25.5 % — ABNORMAL LOW (ref 39.0–52.0)

## 2010-11-17 LAB — CULTURE, BLOOD (ROUTINE X 2): Culture: NO GROWTH

## 2010-11-17 LAB — FUNGUS CULTURE, BLOOD: Culture: NO GROWTH

## 2010-11-17 LAB — URINALYSIS, ROUTINE W REFLEX MICROSCOPIC
Bilirubin Urine: NEGATIVE
Ketones, ur: NEGATIVE mg/dL
Leukocytes, UA: NEGATIVE
Nitrite: POSITIVE — AB
Nitrite: NEGATIVE
Protein, ur: NEGATIVE mg/dL
Protein, ur: 30 mg/dL — AB
Specific Gravity, Urine: 1.013 (ref 1.005–1.030)
Urobilinogen, UA: 0.2 mg/dL (ref 0.0–1.0)
Urobilinogen, UA: 0.2 mg/dL (ref 0.0–1.0)

## 2010-11-17 LAB — NA AND K (SODIUM & POTASSIUM), RAND UR
Potassium Urine: 21 mEq/L
Sodium, Ur: 127 mEq/L

## 2010-11-17 LAB — AFB CULTURE, BLOOD

## 2010-11-17 LAB — HEMOGLOBIN: Hemoglobin: 8.6 g/dL — ABNORMAL LOW (ref 13.0–17.0)

## 2010-11-17 LAB — URINE CULTURE

## 2010-11-17 LAB — TYPE AND SCREEN: Antibody Screen: NEGATIVE

## 2010-11-17 LAB — BONE MARROW EXAM

## 2010-11-17 LAB — PHOSPHORUS
Phosphorus: 3.5 mg/dL (ref 2.3–4.6)
Phosphorus: 3.6 mg/dL (ref 2.3–4.6)

## 2010-11-17 LAB — TRIGLYCERIDES: Triglycerides: 121 mg/dL (ref <150)

## 2010-11-17 LAB — T-HELPER CELLS (CD4) COUNT (NOT AT ARMC)
CD4 % Helper T Cell: 5 % — ABNORMAL LOW (ref 33–55)
CD4 T Cell Abs: 230 uL — ABNORMAL LOW (ref 400–2700)

## 2010-11-17 LAB — PREALBUMIN
Prealbumin: 19.7 mg/dL (ref 18.0–45.0)
Prealbumin: 7.1 mg/dL — ABNORMAL LOW (ref 18.0–45.0)

## 2010-11-18 LAB — PROTIME-INR
INR: 1.51 — ABNORMAL HIGH (ref 0.00–1.49)
INR: 1.66 — ABNORMAL HIGH (ref 0.00–1.49)
INR: 1.94 — ABNORMAL HIGH (ref 0.00–1.49)
INR: 2.04 — ABNORMAL HIGH (ref 0.00–1.49)
INR: 2.32 — ABNORMAL HIGH (ref 0.00–1.49)
INR: 2.44 — ABNORMAL HIGH (ref 0.00–1.49)
Prothrombin Time: 18.1 seconds — ABNORMAL HIGH (ref 11.6–15.2)
Prothrombin Time: 19.5 seconds — ABNORMAL HIGH (ref 11.6–15.2)
Prothrombin Time: 22.9 seconds — ABNORMAL HIGH (ref 11.6–15.2)
Prothrombin Time: 24.7 seconds — ABNORMAL HIGH (ref 11.6–15.2)
Prothrombin Time: 25.3 seconds — ABNORMAL HIGH (ref 11.6–15.2)
Prothrombin Time: 26.3 seconds — ABNORMAL HIGH (ref 11.6–15.2)

## 2010-11-18 LAB — CBC
HCT: 20.7 % — ABNORMAL LOW (ref 39.0–52.0)
HCT: 22.3 % — ABNORMAL LOW (ref 39.0–52.0)
HCT: 22.5 % — ABNORMAL LOW (ref 39.0–52.0)
HCT: 23.7 % — ABNORMAL LOW (ref 39.0–52.0)
HCT: 24.3 % — ABNORMAL LOW (ref 39.0–52.0)
HCT: 27.1 % — ABNORMAL LOW (ref 39.0–52.0)
HCT: 28.7 % — ABNORMAL LOW (ref 39.0–52.0)
HCT: 28.8 % — ABNORMAL LOW (ref 39.0–52.0)
HCT: 29.5 % — ABNORMAL LOW (ref 39.0–52.0)
HCT: 29.6 % — ABNORMAL LOW (ref 39.0–52.0)
Hemoglobin: 10.4 g/dL — ABNORMAL LOW (ref 13.0–17.0)
Hemoglobin: 6.9 g/dL — CL (ref 13.0–17.0)
Hemoglobin: 7.3 g/dL — ABNORMAL LOW (ref 13.0–17.0)
Hemoglobin: 7.5 g/dL — ABNORMAL LOW (ref 13.0–17.0)
Hemoglobin: 7.8 g/dL — ABNORMAL LOW (ref 13.0–17.0)
Hemoglobin: 7.9 g/dL — ABNORMAL LOW (ref 13.0–17.0)
Hemoglobin: 9.2 g/dL — ABNORMAL LOW (ref 13.0–17.0)
Hemoglobin: 9.5 g/dL — ABNORMAL LOW (ref 13.0–17.0)
Hemoglobin: 9.7 g/dL — ABNORMAL LOW (ref 13.0–17.0)
Hemoglobin: 9.8 g/dL — ABNORMAL LOW (ref 13.0–17.0)
MCHC: 32.4 g/dL (ref 30.0–36.0)
MCHC: 32.8 g/dL (ref 30.0–36.0)
MCHC: 33 g/dL (ref 30.0–36.0)
MCHC: 33 g/dL (ref 30.0–36.0)
MCHC: 33 g/dL (ref 30.0–36.0)
MCHC: 33.1 g/dL (ref 30.0–36.0)
MCHC: 33.4 g/dL (ref 30.0–36.0)
MCHC: 33.4 g/dL (ref 30.0–36.0)
MCV: 88.9 fL (ref 78.0–100.0)
MCV: 89 fL (ref 78.0–100.0)
MCV: 89.3 fL (ref 78.0–100.0)
MCV: 89.5 fL (ref 78.0–100.0)
MCV: 89.6 fL (ref 78.0–100.0)
MCV: 89.6 fL (ref 78.0–100.0)
MCV: 89.7 fL (ref 78.0–100.0)
MCV: 89.9 fL (ref 78.0–100.0)
Platelets: 220 10*3/uL (ref 150–400)
Platelets: 245 10*3/uL (ref 150–400)
Platelets: 252 10*3/uL (ref 150–400)
Platelets: 264 10*3/uL (ref 150–400)
Platelets: 266 10*3/uL (ref 150–400)
Platelets: 293 10*3/uL (ref 150–400)
Platelets: 302 10*3/uL (ref 150–400)
Platelets: 308 10*3/uL (ref 150–400)
Platelets: 358 10*3/uL (ref 150–400)
Platelets: 374 10*3/uL (ref 150–400)
Platelets: 390 10*3/uL (ref 150–400)
RBC: 2.52 MIL/uL — ABNORMAL LOW (ref 4.22–5.81)
RBC: 2.71 MIL/uL — ABNORMAL LOW (ref 4.22–5.81)
RBC: 2.97 MIL/uL — ABNORMAL LOW (ref 4.22–5.81)
RBC: 3.17 MIL/uL — ABNORMAL LOW (ref 4.22–5.81)
RBC: 3.22 MIL/uL — ABNORMAL LOW (ref 4.22–5.81)
RBC: 3.24 MIL/uL — ABNORMAL LOW (ref 4.22–5.81)
RBC: 3.29 MIL/uL — ABNORMAL LOW (ref 4.22–5.81)
RBC: 3.55 MIL/uL — ABNORMAL LOW (ref 4.22–5.81)
RDW: 18.3 % — ABNORMAL HIGH (ref 11.5–15.5)
RDW: 18.6 % — ABNORMAL HIGH (ref 11.5–15.5)
RDW: 18.7 % — ABNORMAL HIGH (ref 11.5–15.5)
RDW: 18.7 % — ABNORMAL HIGH (ref 11.5–15.5)
RDW: 18.8 % — ABNORMAL HIGH (ref 11.5–15.5)
RDW: 18.8 % — ABNORMAL HIGH (ref 11.5–15.5)
RDW: 18.8 % — ABNORMAL HIGH (ref 11.5–15.5)
RDW: 18.9 % — ABNORMAL HIGH (ref 11.5–15.5)
RDW: 19 % — ABNORMAL HIGH (ref 11.5–15.5)
RDW: 19.1 % — ABNORMAL HIGH (ref 11.5–15.5)
RDW: 19.5 % — ABNORMAL HIGH (ref 11.5–15.5)
WBC: 4.8 10*3/uL (ref 4.0–10.5)
WBC: 4.8 10*3/uL (ref 4.0–10.5)
WBC: 5 10*3/uL (ref 4.0–10.5)
WBC: 5.3 10*3/uL (ref 4.0–10.5)
WBC: 5.3 10*3/uL (ref 4.0–10.5)
WBC: 5.3 10*3/uL (ref 4.0–10.5)
WBC: 5.8 10*3/uL (ref 4.0–10.5)
WBC: 5.9 10*3/uL (ref 4.0–10.5)
WBC: 6.5 10*3/uL (ref 4.0–10.5)
WBC: 6.6 10*3/uL (ref 4.0–10.5)
WBC: 6.9 10*3/uL (ref 4.0–10.5)

## 2010-11-18 LAB — BASIC METABOLIC PANEL
BUN: 10 mg/dL (ref 6–23)
BUN: 4 mg/dL — ABNORMAL LOW (ref 6–23)
BUN: 4 mg/dL — ABNORMAL LOW (ref 6–23)
BUN: 6 mg/dL (ref 6–23)
CO2: 13 mEq/L — ABNORMAL LOW (ref 19–32)
CO2: 13 mEq/L — ABNORMAL LOW (ref 19–32)
CO2: 14 mEq/L — ABNORMAL LOW (ref 19–32)
Calcium: 7.8 mg/dL — ABNORMAL LOW (ref 8.4–10.5)
Calcium: 8 mg/dL — ABNORMAL LOW (ref 8.4–10.5)
Calcium: 8.4 mg/dL (ref 8.4–10.5)
Calcium: 8.5 mg/dL (ref 8.4–10.5)
Calcium: 8.6 mg/dL (ref 8.4–10.5)
Calcium: 8.6 mg/dL (ref 8.4–10.5)
Chloride: 114 mEq/L — ABNORMAL HIGH (ref 96–112)
Chloride: 126 mEq/L — ABNORMAL HIGH (ref 96–112)
Chloride: 127 mEq/L — ABNORMAL HIGH (ref 96–112)
Chloride: >130 mEq/L (ref 96–112)
Chloride: >130 mEq/L (ref 96–112)
Creatinine, Ser: 1.4 mg/dL (ref 0.4–1.5)
Creatinine, Ser: 1.58 mg/dL — ABNORMAL HIGH (ref 0.4–1.5)
Creatinine, Ser: 2.11 mg/dL — ABNORMAL HIGH (ref 0.4–1.5)
Creatinine, Ser: 2.21 mg/dL — ABNORMAL HIGH (ref 0.4–1.5)
Creatinine, Ser: 2.36 mg/dL — ABNORMAL HIGH (ref 0.4–1.5)
Creatinine, Ser: 2.36 mg/dL — ABNORMAL HIGH (ref 0.4–1.5)
Creatinine, Ser: 2.7 mg/dL — ABNORMAL HIGH (ref 0.4–1.5)
GFR calc Af Amer: 30 mL/min — ABNORMAL LOW (ref >60)
GFR calc Af Amer: 35 mL/min — ABNORMAL LOW (ref >60)
GFR calc Af Amer: 35 mL/min — ABNORMAL LOW (ref >60)
GFR calc Af Amer: 38 mL/min — ABNORMAL LOW (ref >60)
GFR calc Af Amer: 40 mL/min — ABNORMAL LOW (ref >60)
GFR calc Af Amer: 56 mL/min — ABNORMAL LOW (ref >60)
GFR calc non Af Amer: 20 mL/min — ABNORMAL LOW (ref >60)
GFR calc non Af Amer: 25 mL/min — ABNORMAL LOW (ref >60)
GFR calc non Af Amer: 29 mL/min — ABNORMAL LOW (ref >60)
GFR calc non Af Amer: 29 mL/min — ABNORMAL LOW (ref >60)
GFR calc non Af Amer: 31 mL/min — ABNORMAL LOW (ref >60)
GFR calc non Af Amer: 39 mL/min — ABNORMAL LOW (ref >60)
GFR calc non Af Amer: 53 mL/min — ABNORMAL LOW (ref >60)
Glucose, Bld: 72 mg/dL (ref 70–99)
Glucose, Bld: 85 mg/dL (ref 70–99)
Potassium: 2.9 mEq/L — ABNORMAL LOW (ref 3.5–5.1)
Potassium: 2.9 mEq/L — ABNORMAL LOW (ref 3.5–5.1)
Potassium: 2.9 mEq/L — ABNORMAL LOW (ref 3.5–5.1)
Potassium: 3.2 mEq/L — ABNORMAL LOW (ref 3.5–5.1)
Sodium: 141 mEq/L (ref 135–145)
Sodium: 142 mEq/L (ref 135–145)
Sodium: 142 mEq/L (ref 135–145)
Sodium: 147 mEq/L — ABNORMAL HIGH (ref 135–145)
Sodium: 147 mEq/L — ABNORMAL HIGH (ref 135–145)

## 2010-11-18 LAB — CULTURE, BLOOD (ROUTINE X 2)
Culture: NO GROWTH
Culture: NO GROWTH

## 2010-11-18 LAB — MAGNESIUM
Magnesium: 1.2 mg/dL — ABNORMAL LOW (ref 1.5–2.5)
Magnesium: 1.4 mg/dL — ABNORMAL LOW (ref 1.5–2.5)
Magnesium: 1.5 mg/dL (ref 1.5–2.5)
Magnesium: 1.8 mg/dL (ref 1.5–2.5)
Magnesium: 1.9 mg/dL (ref 1.5–2.5)

## 2010-11-18 LAB — COMPREHENSIVE METABOLIC PANEL
ALT: 26 U/L (ref 0–53)
ALT: 28 U/L (ref 0–53)
AST: 42 U/L — ABNORMAL HIGH (ref 0–37)
Albumin: 2.9 g/dL — ABNORMAL LOW (ref 3.5–5.2)
Alkaline Phosphatase: 158 U/L — ABNORMAL HIGH (ref 39–117)
Alkaline Phosphatase: 164 U/L — ABNORMAL HIGH (ref 39–117)
BUN: 12 mg/dL (ref 6–23)
CO2: 12 mEq/L — ABNORMAL LOW (ref 19–32)
CO2: 14 mEq/L — ABNORMAL LOW (ref 19–32)
Calcium: 8.1 mg/dL — ABNORMAL LOW (ref 8.4–10.5)
Chloride: 125 mEq/L — ABNORMAL HIGH (ref 96–112)
Chloride: 127 mEq/L — ABNORMAL HIGH (ref 96–112)
Creatinine, Ser: 2.92 mg/dL — ABNORMAL HIGH (ref 0.4–1.5)
GFR calc Af Amer: 28 mL/min — ABNORMAL LOW (ref >60)
GFR calc non Af Amer: 23 mL/min — ABNORMAL LOW (ref >60)
GFR calc non Af Amer: 34 mL/min — ABNORMAL LOW (ref >60)
Glucose, Bld: 133 mg/dL — ABNORMAL HIGH (ref 70–99)
Glucose, Bld: 83 mg/dL (ref 70–99)
Potassium: 3.1 mEq/L — ABNORMAL LOW (ref 3.5–5.1)
Potassium: 3.3 mEq/L — ABNORMAL LOW (ref 3.5–5.1)
Sodium: 140 mEq/L (ref 135–145)
Sodium: 144 mEq/L (ref 135–145)
Total Bilirubin: 0.5 mg/dL (ref 0.3–1.2)
Total Bilirubin: 0.5 mg/dL (ref 0.3–1.2)
Total Protein: 6.1 g/dL (ref 6.0–8.3)

## 2010-11-18 LAB — URINE CULTURE: Colony Count: 5000

## 2010-11-18 LAB — DIFFERENTIAL
Eosinophils Relative: 3 % (ref 0–5)
Lymphocytes Relative: 37 % (ref 12–46)
Lymphs Abs: 2 10*3/uL (ref 0.7–4.0)
Monocytes Absolute: 0.5 10*3/uL (ref 0.1–1.0)

## 2010-11-18 LAB — FOLATE
Folate: >20 ng/mL
Folate: >24 ng/mL

## 2010-11-18 LAB — PROTEIN ELECTROPH W RFLX QUANT IMMUNOGLOBULINS
Albumin ELP: 55.3 % — ABNORMAL LOW (ref 55.8–66.1)
Alpha-1-Globulin: 5.3 % — ABNORMAL HIGH (ref 2.9–4.9)
Alpha-2-Globulin: 8.7 % (ref 7.1–11.8)
Beta 2: 3.2 % (ref 3.2–6.5)
Beta Globulin: 3.2 % — ABNORMAL LOW (ref 4.7–7.2)
Gamma Globulin: 24.3 % — ABNORMAL HIGH (ref 11.1–18.8)
M-Spike, %: NOT DETECTED g/dL
Total Protein ELP: 6.2 g/dL (ref 6.0–8.3)

## 2010-11-18 LAB — CORTISOL-AM, BLOOD: Cortisol - AM: 18.9 ug/dL (ref 4.3–22.4)

## 2010-11-18 LAB — URINALYSIS, MICROSCOPIC ONLY
Bilirubin Urine: NEGATIVE
Glucose, UA: 250 mg/dL — AB
Ketones, ur: NEGATIVE mg/dL
Nitrite: NEGATIVE
pH: 6 (ref 5.0–8.0)

## 2010-11-18 LAB — IRON AND TIBC
Iron: 62 ug/dL (ref 42–135)
Saturation Ratios: 39 % (ref 20–55)
Saturation Ratios: 57 % — ABNORMAL HIGH (ref 20–55)
TIBC: 150 ug/dL — ABNORMAL LOW (ref 215–435)
TIBC: 158 ug/dL — ABNORMAL LOW (ref 215–435)
UIBC: 96 ug/dL

## 2010-11-18 LAB — VITAMIN B12: Vitamin B-12: 855 pg/mL (ref 211–911)

## 2010-11-18 LAB — MICROALBUMIN, URINE: Microalb, Ur: 7.32 mg/dL — ABNORMAL HIGH (ref 0.00–1.89)

## 2010-11-18 LAB — UREA NITROGEN, URINE: Urea Nitrogen, Ur: 42 mg/dL

## 2010-11-18 LAB — URINALYSIS, ROUTINE W REFLEX MICROSCOPIC
Bilirubin Urine: NEGATIVE
Glucose, UA: 250 mg/dL — AB
Ketones, ur: NEGATIVE mg/dL
Specific Gravity, Urine: 1.01 (ref 1.005–1.030)
pH: 5.5 (ref 5.0–8.0)

## 2010-11-18 LAB — CROSSMATCH
ABO/RH(D): O POS
Antibody Screen: NEGATIVE

## 2010-11-18 LAB — PHOSPHORUS
Phosphorus: 2.1 mg/dL — ABNORMAL LOW (ref 2.3–4.6)
Phosphorus: 2.9 mg/dL (ref 2.3–4.6)

## 2010-11-18 LAB — URINE MICROSCOPIC-ADD ON

## 2010-11-18 LAB — OSMOLALITY, URINE: Osmolality, Ur: 270 mOsm/kg — ABNORMAL LOW (ref 390–1090)

## 2010-11-18 LAB — TSH: TSH: 1.887 u[IU]/mL (ref 0.350–4.500)

## 2010-11-18 LAB — RETICULOCYTES
RBC.: 2.8 MIL/uL — ABNORMAL LOW (ref 4.22–5.81)
Retic Count, Absolute: 34 10*3/uL (ref 19.0–186.0)
Retic Count, Absolute: 36.4 10*3/uL (ref 19.0–186.0)
Retic Ct Pct: 1.3 % (ref 0.4–3.1)

## 2010-11-18 LAB — HIV-1 RNA QUANT-NO REFLEX-BLD
HIV 1 RNA Quant: <48 copies/mL (ref <48)
HIV-1 RNA Quant, Log: <1.68 {Log} (ref <1.68)

## 2010-11-18 LAB — PHOSPHORUS, URINE, RANDOM: Phosphorus, Ur: 11.2 mg/dL

## 2010-11-18 LAB — BLOOD GAS, ARTERIAL
Acid-base deficit: 17.1 mmol/L — ABNORMAL HIGH (ref 0.0–2.0)
O2 Saturation: 97.3 %
pH, Arterial: 7.194 — CL (ref 7.350–7.450)
pO2, Arterial: 117 mmHg — ABNORMAL HIGH (ref 80.0–100.0)

## 2010-11-18 LAB — MISCELLANEOUS TEST

## 2010-11-18 LAB — PREPARE FRESH FROZEN PLASMA

## 2010-11-18 LAB — OSMOLALITY: Osmolality: 303 mOsm/kg — ABNORMAL HIGH (ref 275–300)

## 2010-11-18 LAB — LACTATE DEHYDROGENASE: LDH: 167 U/L (ref 94–250)

## 2010-11-18 LAB — T-HELPER CELLS (CD4) COUNT (NOT AT ARMC)
CD4 % Helper T Cell: 9 % — ABNORMAL LOW (ref 33–55)
CD4 T Cell Abs: 210 uL — ABNORMAL LOW (ref 400–2700)

## 2010-11-18 LAB — FERRITIN: Ferritin: 629 ng/mL — ABNORMAL HIGH (ref 22–322)

## 2010-11-18 LAB — CHLORIDE, URINE, RANDOM: Chloride Urine: 82 mEq/L

## 2010-11-18 LAB — URIC ACID, RANDOM URINE: Uric Acid, Urine: 13.2 mg/dL

## 2010-11-18 LAB — CREATININE, URINE, RANDOM: Creatinine, Urine: 18.1 mg/dL

## 2010-11-18 LAB — NA AND K (SODIUM & POTASSIUM), RAND UR: Potassium Urine: 18 mEq/L

## 2010-11-18 LAB — PREPARE RBC (CROSSMATCH)

## 2010-11-18 LAB — URIC ACID: Uric Acid, Serum: 2.3 mg/dL — ABNORMAL LOW (ref 4.0–7.8)

## 2010-11-18 LAB — LACTIC ACID, PLASMA: Lactic Acid, Venous: 2.4 mmol/L — ABNORMAL HIGH (ref 0.5–2.2)

## 2010-11-20 LAB — BASIC METABOLIC PANEL
BUN: 17 mg/dL (ref 6–23)
BUN: 18 mg/dL (ref 6–23)
CO2: 30 mEq/L (ref 19–32)
Calcium: 8.6 mg/dL (ref 8.4–10.5)
Creatinine, Ser: 0.94 mg/dL (ref 0.4–1.5)
GFR calc Af Amer: >60 mL/min (ref >60)
GFR calc Af Amer: >60 mL/min (ref >60)
GFR calc non Af Amer: >60 mL/min (ref >60)
GFR calc non Af Amer: >60 mL/min (ref >60)
GFR calc non Af Amer: >60 mL/min (ref >60)
Glucose, Bld: 119 mg/dL — ABNORMAL HIGH (ref 70–99)
Potassium: 4.3 mEq/L (ref 3.5–5.1)
Potassium: 4.9 mEq/L (ref 3.5–5.1)
Sodium: 132 mEq/L — ABNORMAL LOW (ref 135–145)

## 2010-11-20 LAB — GLUCOSE, CAPILLARY
Glucose-Capillary: 111 mg/dL — ABNORMAL HIGH (ref 70–99)
Glucose-Capillary: 114 mg/dL — ABNORMAL HIGH (ref 70–99)
Glucose-Capillary: 116 mg/dL — ABNORMAL HIGH (ref 70–99)
Glucose-Capillary: 119 mg/dL — ABNORMAL HIGH (ref 70–99)
Glucose-Capillary: 119 mg/dL — ABNORMAL HIGH (ref 70–99)
Glucose-Capillary: 126 mg/dL — ABNORMAL HIGH (ref 70–99)
Glucose-Capillary: 129 mg/dL — ABNORMAL HIGH (ref 70–99)
Glucose-Capillary: 131 mg/dL — ABNORMAL HIGH (ref 70–99)
Glucose-Capillary: 150 mg/dL — ABNORMAL HIGH (ref 70–99)
Glucose-Capillary: 83 mg/dL (ref 70–99)
Glucose-Capillary: 83 mg/dL (ref 70–99)
Glucose-Capillary: 85 mg/dL (ref 70–99)
Glucose-Capillary: 85 mg/dL (ref 70–99)
Glucose-Capillary: 86 mg/dL (ref 70–99)
Glucose-Capillary: 89 mg/dL (ref 70–99)
Glucose-Capillary: 90 mg/dL (ref 70–99)
Glucose-Capillary: 91 mg/dL (ref 70–99)
Glucose-Capillary: 94 mg/dL (ref 70–99)
Glucose-Capillary: 99 mg/dL (ref 70–99)
Glucose-Capillary: 99 mg/dL (ref 70–99)

## 2010-11-20 LAB — PROTIME-INR
INR: 1.42 (ref 0.00–1.49)
INR: 1.66 — ABNORMAL HIGH (ref 0.00–1.49)
INR: 2.54 — ABNORMAL HIGH (ref 0.00–1.49)
INR: 3.57 — ABNORMAL HIGH (ref 0.00–1.49)
INR: 3.91 — ABNORMAL HIGH (ref 0.00–1.49)
Prothrombin Time: 17.2 seconds — ABNORMAL HIGH (ref 11.6–15.2)
Prothrombin Time: 20.2 seconds — ABNORMAL HIGH (ref 11.6–15.2)
Prothrombin Time: 27.1 seconds — ABNORMAL HIGH (ref 11.6–15.2)
Prothrombin Time: 31.6 seconds — ABNORMAL HIGH (ref 11.6–15.2)

## 2010-11-20 LAB — CBC
HCT: 24.6 % — ABNORMAL LOW (ref 39.0–52.0)
HCT: 24.8 % — ABNORMAL LOW (ref 39.0–52.0)
HCT: 25.8 % — ABNORMAL LOW (ref 39.0–52.0)
Hemoglobin: 8.3 g/dL — ABNORMAL LOW (ref 13.0–17.0)
Hemoglobin: 8.4 g/dL — ABNORMAL LOW (ref 13.0–17.0)
MCHC: 32.8 g/dL (ref 30.0–36.0)
MCHC: 34 g/dL (ref 30.0–36.0)
MCV: 90 fL (ref 78.0–100.0)
Platelets: 633 10*3/uL — ABNORMAL HIGH (ref 150–400)
Platelets: 649 10*3/uL — ABNORMAL HIGH (ref 150–400)
Platelets: 671 10*3/uL — ABNORMAL HIGH (ref 150–400)
Platelets: 677 10*3/uL — ABNORMAL HIGH (ref 150–400)
RBC: 2.71 MIL/uL — ABNORMAL LOW (ref 4.22–5.81)
RDW: 19.2 % — ABNORMAL HIGH (ref 11.5–15.5)
RDW: 19.4 % — ABNORMAL HIGH (ref 11.5–15.5)
WBC: 13.3 10*3/uL — ABNORMAL HIGH (ref 4.0–10.5)
WBC: 13.5 10*3/uL — ABNORMAL HIGH (ref 4.0–10.5)
WBC: 15.7 10*3/uL — ABNORMAL HIGH (ref 4.0–10.5)

## 2010-11-20 LAB — T-HELPER CELL (CD4) - (RCID CLINIC ONLY)
CD4 % Helper T Cell: 7 % — ABNORMAL LOW (ref 33–55)
CD4 T Cell Abs: 200 uL — ABNORMAL LOW (ref 400–2700)

## 2010-11-20 LAB — COMPREHENSIVE METABOLIC PANEL
Albumin: 2.3 g/dL — ABNORMAL LOW (ref 3.5–5.2)
BUN: 19 mg/dL (ref 6–23)
Calcium: 8.7 mg/dL (ref 8.4–10.5)
GFR calc non Af Amer: >60 mL/min (ref >60)
Glucose, Bld: 121 mg/dL — ABNORMAL HIGH (ref 70–99)
Potassium: 4.5 mEq/L (ref 3.5–5.1)
Sodium: 133 mEq/L — ABNORMAL LOW (ref 135–145)
Total Bilirubin: 0.5 mg/dL (ref 0.3–1.2)
Total Protein: 6.4 g/dL (ref 6.0–8.3)

## 2010-11-20 LAB — CLOSTRIDIUM DIFFICILE EIA: C difficile Toxins A+B, EIA: NEGATIVE

## 2010-11-24 LAB — COMPREHENSIVE METABOLIC PANEL
ALT: 33 U/L (ref 0–53)
AST: 46 U/L — ABNORMAL HIGH (ref 0–37)
Albumin: 3.3 g/dL — ABNORMAL LOW (ref 3.5–5.2)
Alkaline Phosphatase: 192 U/L — ABNORMAL HIGH (ref 39–117)
GFR calc Af Amer: >60 mL/min (ref >60)
Glucose, Bld: 96 mg/dL (ref 70–99)
Potassium: 3.3 mEq/L — ABNORMAL LOW (ref 3.5–5.1)
Sodium: 135 mEq/L (ref 135–145)
Total Protein: 7.6 g/dL (ref 6.0–8.3)

## 2010-11-24 LAB — CBC
Hemoglobin: 9.7 g/dL — ABNORMAL LOW (ref 13.0–17.0)
Platelets: 639 10*3/uL — ABNORMAL HIGH (ref 150–400)
RDW: 16.8 % — ABNORMAL HIGH (ref 11.5–15.5)

## 2010-12-02 LAB — CBC
HCT: 21.4 % — ABNORMAL LOW (ref 39.0–52.0)
HCT: 21.7 % — ABNORMAL LOW (ref 39.0–52.0)
HCT: 22.4 % — ABNORMAL LOW (ref 39.0–52.0)
HCT: 22.5 % — ABNORMAL LOW (ref 39.0–52.0)
HCT: 22.7 % — ABNORMAL LOW (ref 39.0–52.0)
HCT: 25.7 % — ABNORMAL LOW (ref 39.0–52.0)
HCT: 26.8 % — ABNORMAL LOW (ref 39.0–52.0)
HCT: 26.9 % — ABNORMAL LOW (ref 39.0–52.0)
HCT: 28.5 % — ABNORMAL LOW (ref 39.0–52.0)
HCT: 29.8 % — ABNORMAL LOW (ref 39.0–52.0)
HCT: 36.2 % — ABNORMAL LOW (ref 39.0–52.0)
HCT: 39.8 % (ref 39.0–52.0)
Hemoglobin: 13.1 g/dL (ref 13.0–17.0)
Hemoglobin: 7.1 g/dL — ABNORMAL LOW (ref 13.0–17.0)
Hemoglobin: 7.1 g/dL — ABNORMAL LOW (ref 13.0–17.0)
Hemoglobin: 7.4 g/dL — ABNORMAL LOW (ref 13.0–17.0)
Hemoglobin: 8.7 g/dL — ABNORMAL LOW (ref 13.0–17.0)
Hemoglobin: 9.4 g/dL — ABNORMAL LOW (ref 13.0–17.0)
Hemoglobin: 9.5 g/dL — ABNORMAL LOW (ref 13.0–17.0)
MCHC: 32.1 g/dL (ref 30.0–36.0)
MCHC: 32.5 g/dL (ref 30.0–36.0)
MCHC: 32.8 g/dL (ref 30.0–36.0)
MCHC: 32.9 g/dL (ref 30.0–36.0)
MCHC: 33 g/dL (ref 30.0–36.0)
MCHC: 33.1 g/dL (ref 30.0–36.0)
MCHC: 33.3 g/dL (ref 30.0–36.0)
MCHC: 33.3 g/dL (ref 30.0–36.0)
MCHC: 33.3 g/dL (ref 30.0–36.0)
MCHC: 33.3 g/dL (ref 30.0–36.0)
MCHC: 33.3 g/dL (ref 30.0–36.0)
MCHC: 33.3 g/dL (ref 30.0–36.0)
MCHC: 33.4 g/dL (ref 30.0–36.0)
MCHC: 33.8 g/dL (ref 30.0–36.0)
MCHC: 34 g/dL (ref 30.0–36.0)
MCV: 87.1 fL (ref 78.0–100.0)
MCV: 87.6 fL (ref 78.0–100.0)
MCV: 87.7 fL (ref 78.0–100.0)
MCV: 87.8 fL (ref 78.0–100.0)
MCV: 87.8 fL (ref 78.0–100.0)
MCV: 87.9 fL (ref 78.0–100.0)
MCV: 88.1 fL (ref 78.0–100.0)
MCV: 88.4 fL (ref 78.0–100.0)
MCV: 89.2 fL (ref 78.0–100.0)
MCV: 89.3 fL (ref 78.0–100.0)
MCV: 89.4 fL (ref 78.0–100.0)
Platelets: 172 10*3/uL (ref 150–400)
Platelets: 194 10*3/uL (ref 150–400)
Platelets: 329 10*3/uL (ref 150–400)
Platelets: 369 10*3/uL (ref 150–400)
Platelets: 463 10*3/uL — ABNORMAL HIGH (ref 150–400)
Platelets: 486 10*3/uL — ABNORMAL HIGH (ref 150–400)
Platelets: 50 10*3/uL — ABNORMAL LOW (ref 150–400)
Platelets: 52 10*3/uL — ABNORMAL LOW (ref 150–400)
Platelets: 537 10*3/uL — ABNORMAL HIGH (ref 150–400)
Platelets: 581 10*3/uL — ABNORMAL HIGH (ref 150–400)
Platelets: 598 10*3/uL — ABNORMAL HIGH (ref 150–400)
Platelets: 600 10*3/uL — ABNORMAL HIGH (ref 150–400)
Platelets: 615 10*3/uL — ABNORMAL HIGH (ref 150–400)
RBC: 2.34 MIL/uL — ABNORMAL LOW (ref 4.22–5.81)
RBC: 2.42 MIL/uL — ABNORMAL LOW (ref 4.22–5.81)
RBC: 2.43 MIL/uL — ABNORMAL LOW (ref 4.22–5.81)
RBC: 2.43 MIL/uL — ABNORMAL LOW (ref 4.22–5.81)
RBC: 2.54 MIL/uL — ABNORMAL LOW (ref 4.22–5.81)
RBC: 2.86 MIL/uL — ABNORMAL LOW (ref 4.22–5.81)
RBC: 2.95 MIL/uL — ABNORMAL LOW (ref 4.22–5.81)
RBC: 2.95 MIL/uL — ABNORMAL LOW (ref 4.22–5.81)
RBC: 3 MIL/uL — ABNORMAL LOW (ref 4.22–5.81)
RBC: 3.01 MIL/uL — ABNORMAL LOW (ref 4.22–5.81)
RBC: 3.04 MIL/uL — ABNORMAL LOW (ref 4.22–5.81)
RBC: 3.22 MIL/uL — ABNORMAL LOW (ref 4.22–5.81)
RBC: 3.24 MIL/uL — ABNORMAL LOW (ref 4.22–5.81)
RBC: 3.39 MIL/uL — ABNORMAL LOW (ref 4.22–5.81)
RBC: 3.6 MIL/uL — ABNORMAL LOW (ref 4.22–5.81)
RBC: 4.52 MIL/uL (ref 4.22–5.81)
RDW: 16.1 % — ABNORMAL HIGH (ref 11.5–15.5)
RDW: 16.1 % — ABNORMAL HIGH (ref 11.5–15.5)
RDW: 16.4 % — ABNORMAL HIGH (ref 11.5–15.5)
RDW: 16.5 % — ABNORMAL HIGH (ref 11.5–15.5)
RDW: 16.9 % — ABNORMAL HIGH (ref 11.5–15.5)
RDW: 17 % — ABNORMAL HIGH (ref 11.5–15.5)
RDW: 17 % — ABNORMAL HIGH (ref 11.5–15.5)
RDW: 17 % — ABNORMAL HIGH (ref 11.5–15.5)
RDW: 17.1 % — ABNORMAL HIGH (ref 11.5–15.5)
RDW: 18 % — ABNORMAL HIGH (ref 11.5–15.5)
RDW: 18.6 % — ABNORMAL HIGH (ref 11.5–15.5)
RDW: 18.7 % — ABNORMAL HIGH (ref 11.5–15.5)
WBC: 11.1 10*3/uL — ABNORMAL HIGH (ref 4.0–10.5)
WBC: 11.1 10*3/uL — ABNORMAL HIGH (ref 4.0–10.5)
WBC: 11.6 10*3/uL — ABNORMAL HIGH (ref 4.0–10.5)
WBC: 13.2 10*3/uL — ABNORMAL HIGH (ref 4.0–10.5)
WBC: 14 10*3/uL — ABNORMAL HIGH (ref 4.0–10.5)
WBC: 14 10*3/uL — ABNORMAL HIGH (ref 4.0–10.5)
WBC: 14.2 10*3/uL — ABNORMAL HIGH (ref 4.0–10.5)
WBC: 17.3 10*3/uL — ABNORMAL HIGH (ref 4.0–10.5)
WBC: 21.9 10*3/uL — ABNORMAL HIGH (ref 4.0–10.5)
WBC: 23.8 10*3/uL — ABNORMAL HIGH (ref 4.0–10.5)
WBC: 9.1 10*3/uL (ref 4.0–10.5)
WBC: 9.4 10*3/uL (ref 4.0–10.5)
WBC: 9.8 10*3/uL (ref 4.0–10.5)

## 2010-12-02 LAB — ANTIPHOSPHOLIPID SYNDROME EVAL, BLD
Anticardiolipin IgG: <3 GPL U/mL — ABNORMAL LOW (ref <10)
Anticardiolipin IgM: <3 MPL U/mL — ABNORMAL LOW (ref <10)
PTTLA Confirmation: 12.4 secs — ABNORMAL HIGH (ref <8.0)
Phosphatydalserine, IgA: <20 U/mL (ref <20)
Phosphatydalserine, IgM: <25 U/mL (ref <25)
dRVVT Incubated 1:1 Mix: 45.1 secs (ref 36.1–47.0)

## 2010-12-02 LAB — BASIC METABOLIC PANEL
BUN: 11 mg/dL (ref 6–23)
BUN: 16 mg/dL (ref 6–23)
BUN: 21 mg/dL (ref 6–23)
BUN: 24 mg/dL — ABNORMAL HIGH (ref 6–23)
BUN: 33 mg/dL — ABNORMAL HIGH (ref 6–23)
BUN: 46 mg/dL — ABNORMAL HIGH (ref 6–23)
BUN: 9 mg/dL (ref 6–23)
CO2: 16 mEq/L — ABNORMAL LOW (ref 19–32)
CO2: 17 mEq/L — ABNORMAL LOW (ref 19–32)
CO2: 19 mEq/L (ref 19–32)
CO2: 22 mEq/L (ref 19–32)
CO2: 22 mEq/L (ref 19–32)
CO2: 22 mEq/L (ref 19–32)
CO2: 24 mEq/L (ref 19–32)
Calcium: 7.4 mg/dL — ABNORMAL LOW (ref 8.4–10.5)
Calcium: 7.5 mg/dL — ABNORMAL LOW (ref 8.4–10.5)
Calcium: 7.7 mg/dL — ABNORMAL LOW (ref 8.4–10.5)
Calcium: 7.9 mg/dL — ABNORMAL LOW (ref 8.4–10.5)
Calcium: 7.9 mg/dL — ABNORMAL LOW (ref 8.4–10.5)
Calcium: 8.1 mg/dL — ABNORMAL LOW (ref 8.4–10.5)
Calcium: 8.1 mg/dL — ABNORMAL LOW (ref 8.4–10.5)
Calcium: 8.2 mg/dL — ABNORMAL LOW (ref 8.4–10.5)
Calcium: 8.3 mg/dL — ABNORMAL LOW (ref 8.4–10.5)
Calcium: 8.3 mg/dL — ABNORMAL LOW (ref 8.4–10.5)
Chloride: 103 mEq/L (ref 96–112)
Chloride: 104 mEq/L (ref 96–112)
Chloride: 104 mEq/L (ref 96–112)
Chloride: 104 mEq/L (ref 96–112)
Chloride: 107 mEq/L (ref 96–112)
Chloride: 107 mEq/L (ref 96–112)
Chloride: 110 mEq/L (ref 96–112)
Creatinine, Ser: 0.73 mg/dL (ref 0.4–1.5)
Creatinine, Ser: 0.83 mg/dL (ref 0.4–1.5)
Creatinine, Ser: 0.98 mg/dL (ref 0.4–1.5)
Creatinine, Ser: 0.98 mg/dL (ref 0.4–1.5)
Creatinine, Ser: 0.99 mg/dL (ref 0.4–1.5)
Creatinine, Ser: 1.05 mg/dL (ref 0.4–1.5)
Creatinine, Ser: 1.15 mg/dL (ref 0.4–1.5)
Creatinine, Ser: 1.23 mg/dL (ref 0.4–1.5)
Creatinine, Ser: 1.72 mg/dL — ABNORMAL HIGH (ref 0.4–1.5)
Creatinine, Ser: 1.79 mg/dL — ABNORMAL HIGH (ref 0.4–1.5)
GFR calc Af Amer: 31 mL/min — ABNORMAL LOW (ref >60)
GFR calc Af Amer: 49 mL/min — ABNORMAL LOW (ref >60)
GFR calc Af Amer: 51 mL/min — ABNORMAL LOW (ref >60)
GFR calc Af Amer: >60 mL/min (ref >60)
GFR calc Af Amer: >60 mL/min (ref >60)
GFR calc Af Amer: >60 mL/min (ref >60)
GFR calc Af Amer: >60 mL/min (ref >60)
GFR calc Af Amer: >60 mL/min (ref >60)
GFR calc Af Amer: >60 mL/min (ref >60)
GFR calc Af Amer: >60 mL/min (ref >60)
GFR calc non Af Amer: 42 mL/min — ABNORMAL LOW (ref >60)
GFR calc non Af Amer: 57 mL/min — ABNORMAL LOW (ref >60)
GFR calc non Af Amer: >60 mL/min (ref >60)
GFR calc non Af Amer: >60 mL/min (ref >60)
GFR calc non Af Amer: >60 mL/min (ref >60)
GFR calc non Af Amer: >60 mL/min (ref >60)
Glucose, Bld: 100 mg/dL — ABNORMAL HIGH (ref 70–99)
Glucose, Bld: 100 mg/dL — ABNORMAL HIGH (ref 70–99)
Glucose, Bld: 122 mg/dL — ABNORMAL HIGH (ref 70–99)
Glucose, Bld: 128 mg/dL — ABNORMAL HIGH (ref 70–99)
Glucose, Bld: 135 mg/dL — ABNORMAL HIGH (ref 70–99)
Glucose, Bld: 142 mg/dL — ABNORMAL HIGH (ref 70–99)
Potassium: 3.2 mEq/L — ABNORMAL LOW (ref 3.5–5.1)
Potassium: 3.3 mEq/L — ABNORMAL LOW (ref 3.5–5.1)
Potassium: 4 mEq/L (ref 3.5–5.1)
Potassium: 4 mEq/L (ref 3.5–5.1)
Potassium: 4.3 mEq/L (ref 3.5–5.1)
Potassium: 4.4 mEq/L (ref 3.5–5.1)
Potassium: 5.1 mEq/L (ref 3.5–5.1)
Sodium: 127 mEq/L — ABNORMAL LOW (ref 135–145)
Sodium: 133 mEq/L — ABNORMAL LOW (ref 135–145)
Sodium: 135 mEq/L (ref 135–145)
Sodium: 135 mEq/L (ref 135–145)

## 2010-12-02 LAB — HEPARIN INDUCED THROMBOCYTOPENIA PNL: Patient O.D.: 0.264

## 2010-12-02 LAB — GLUCOSE, CAPILLARY
Glucose-Capillary: 101 mg/dL — ABNORMAL HIGH (ref 70–99)
Glucose-Capillary: 101 mg/dL — ABNORMAL HIGH (ref 70–99)
Glucose-Capillary: 102 mg/dL — ABNORMAL HIGH (ref 70–99)
Glucose-Capillary: 104 mg/dL — ABNORMAL HIGH (ref 70–99)
Glucose-Capillary: 105 mg/dL — ABNORMAL HIGH (ref 70–99)
Glucose-Capillary: 108 mg/dL — ABNORMAL HIGH (ref 70–99)
Glucose-Capillary: 110 mg/dL — ABNORMAL HIGH (ref 70–99)
Glucose-Capillary: 112 mg/dL — ABNORMAL HIGH (ref 70–99)
Glucose-Capillary: 112 mg/dL — ABNORMAL HIGH (ref 70–99)
Glucose-Capillary: 118 mg/dL — ABNORMAL HIGH (ref 70–99)
Glucose-Capillary: 125 mg/dL — ABNORMAL HIGH (ref 70–99)
Glucose-Capillary: 126 mg/dL — ABNORMAL HIGH (ref 70–99)
Glucose-Capillary: 128 mg/dL — ABNORMAL HIGH (ref 70–99)
Glucose-Capillary: 130 mg/dL — ABNORMAL HIGH (ref 70–99)
Glucose-Capillary: 131 mg/dL — ABNORMAL HIGH (ref 70–99)
Glucose-Capillary: 131 mg/dL — ABNORMAL HIGH (ref 70–99)
Glucose-Capillary: 139 mg/dL — ABNORMAL HIGH (ref 70–99)
Glucose-Capillary: 140 mg/dL — ABNORMAL HIGH (ref 70–99)
Glucose-Capillary: 140 mg/dL — ABNORMAL HIGH (ref 70–99)
Glucose-Capillary: 141 mg/dL — ABNORMAL HIGH (ref 70–99)
Glucose-Capillary: 142 mg/dL — ABNORMAL HIGH (ref 70–99)
Glucose-Capillary: 153 mg/dL — ABNORMAL HIGH (ref 70–99)
Glucose-Capillary: 156 mg/dL — ABNORMAL HIGH (ref 70–99)
Glucose-Capillary: 158 mg/dL — ABNORMAL HIGH (ref 70–99)
Glucose-Capillary: 186 mg/dL — ABNORMAL HIGH (ref 70–99)
Glucose-Capillary: 187 mg/dL — ABNORMAL HIGH (ref 70–99)
Glucose-Capillary: 248 mg/dL — ABNORMAL HIGH (ref 70–99)
Glucose-Capillary: 68 mg/dL — ABNORMAL LOW (ref 70–99)
Glucose-Capillary: 69 mg/dL — ABNORMAL LOW (ref 70–99)
Glucose-Capillary: 84 mg/dL (ref 70–99)
Glucose-Capillary: 86 mg/dL (ref 70–99)

## 2010-12-02 LAB — LIPID PANEL
Total CHOL/HDL Ratio: 2.7 RATIO
Triglycerides: 55 mg/dL (ref <150)
VLDL: 11 mg/dL (ref 0–40)

## 2010-12-02 LAB — BLOOD GAS, ARTERIAL
O2 Content: 2 L/min
O2 Saturation: 96.9 %
pCO2 arterial: 28.8 mmHg — ABNORMAL LOW (ref 35.0–45.0)
pH, Arterial: 7.468 — ABNORMAL HIGH (ref 7.350–7.450)
pO2, Arterial: 91.9 mmHg (ref 80.0–100.0)

## 2010-12-02 LAB — CARDIOLIPIN ANTIBODIES, IGG, IGM, IGA
Anticardiolipin IgA: <3 APL U/mL — ABNORMAL LOW (ref <10)
Anticardiolipin IgG: <3 GPL U/mL — ABNORMAL LOW (ref <10)

## 2010-12-02 LAB — RENAL FUNCTION PANEL
Albumin: 1.9 g/dL — ABNORMAL LOW (ref 3.5–5.2)
Calcium: 8 mg/dL — ABNORMAL LOW (ref 8.4–10.5)
GFR calc Af Amer: >60 mL/min (ref >60)
GFR calc non Af Amer: >60 mL/min (ref >60)
Phosphorus: 3.2 mg/dL (ref 2.3–4.6)
Sodium: 135 mEq/L (ref 135–145)

## 2010-12-02 LAB — PROTIME-INR
INR: 1.18 (ref 0.00–1.49)
INR: 1.43 (ref 0.00–1.49)
INR: 1.58 — ABNORMAL HIGH (ref 0.00–1.49)
INR: 1.81 — ABNORMAL HIGH (ref 0.00–1.49)
INR: 2.12 — ABNORMAL HIGH (ref 0.00–1.49)
INR: 2.83 — ABNORMAL HIGH (ref 0.00–1.49)
INR: 3 — ABNORMAL HIGH (ref 0.00–1.49)
INR: 4.12 — ABNORMAL HIGH (ref 0.00–1.49)
INR: 4.33 — ABNORMAL HIGH (ref 0.00–1.49)
INR: 5.63 (ref 0.00–1.49)
Prothrombin Time: 14.9 seconds (ref 11.6–15.2)
Prothrombin Time: 17.1 seconds — ABNORMAL HIGH (ref 11.6–15.2)
Prothrombin Time: 18 seconds — ABNORMAL HIGH (ref 11.6–15.2)
Prothrombin Time: 18.7 seconds — ABNORMAL HIGH (ref 11.6–15.2)
Prothrombin Time: 22.4 seconds — ABNORMAL HIGH (ref 11.6–15.2)
Prothrombin Time: 23.6 seconds — ABNORMAL HIGH (ref 11.6–15.2)
Prothrombin Time: 29.5 seconds — ABNORMAL HIGH (ref 11.6–15.2)
Prothrombin Time: 30.9 seconds — ABNORMAL HIGH (ref 11.6–15.2)
Prothrombin Time: 32.9 seconds — ABNORMAL HIGH (ref 11.6–15.2)
Prothrombin Time: 36.3 seconds — ABNORMAL HIGH (ref 11.6–15.2)
Prothrombin Time: 39.6 seconds — ABNORMAL HIGH (ref 11.6–15.2)

## 2010-12-02 LAB — AFB CULTURE, BLOOD

## 2010-12-02 LAB — APTT
aPTT: 107 seconds — ABNORMAL HIGH (ref 24–37)
aPTT: 117 seconds — ABNORMAL HIGH (ref 24–37)
aPTT: 35 seconds (ref 24–37)
aPTT: 44 seconds — ABNORMAL HIGH (ref 24–37)
aPTT: 53 seconds — ABNORMAL HIGH (ref 24–37)
aPTT: 59 seconds — ABNORMAL HIGH (ref 24–37)
aPTT: 80 seconds — ABNORMAL HIGH (ref 24–37)
aPTT: 84 seconds — ABNORMAL HIGH (ref 24–37)
aPTT: 85 seconds — ABNORMAL HIGH (ref 24–37)
aPTT: 89 seconds — ABNORMAL HIGH (ref 24–37)

## 2010-12-02 LAB — CLOSTRIDIUM DIFFICILE EIA
C difficile Toxins A+B, EIA: NEGATIVE
C difficile Toxins A+B, EIA: NEGATIVE

## 2010-12-02 LAB — CROSSMATCH
ABO/RH(D): O POS
ABO/RH(D): O POS
Antibody Screen: NEGATIVE

## 2010-12-02 LAB — ROTAVIRUS ANTIGEN, STOOL: Rotavirus: NEGATIVE

## 2010-12-02 LAB — PREPARE FRESH FROZEN PLASMA

## 2010-12-02 LAB — COMPREHENSIVE METABOLIC PANEL
ALT: 43 U/L (ref 0–53)
AST: 40 U/L — ABNORMAL HIGH (ref 0–37)
Albumin: 2.2 g/dL — ABNORMAL LOW (ref 3.5–5.2)
BUN: 19 mg/dL (ref 6–23)
CO2: 23 mEq/L (ref 19–32)
Calcium: 8 mg/dL — ABNORMAL LOW (ref 8.4–10.5)
Chloride: 103 mEq/L (ref 96–112)
Creatinine, Ser: 1.15 mg/dL (ref 0.4–1.5)
GFR calc Af Amer: >60 mL/min (ref >60)
GFR calc non Af Amer: >60 mL/min (ref >60)
Glucose, Bld: 124 mg/dL — ABNORMAL HIGH (ref 70–99)
Potassium: 4.1 mEq/L (ref 3.5–5.1)
Sodium: 131 mEq/L — ABNORMAL LOW (ref 135–145)
Total Bilirubin: 2.6 mg/dL — ABNORMAL HIGH (ref 0.3–1.2)
Total Protein: 6.4 g/dL (ref 6.0–8.3)

## 2010-12-02 LAB — MAGNESIUM
Magnesium: 1.6 mg/dL (ref 1.5–2.5)
Magnesium: 2 mg/dL (ref 1.5–2.5)
Magnesium: 2 mg/dL (ref 1.5–2.5)

## 2010-12-02 LAB — PROTEIN S, TOTAL: Protein S Ag, Total: 68 % — ABNORMAL LOW (ref 70–140)

## 2010-12-02 LAB — CYTOMEGALOVIRUS ANTIBODY, IGG: Cytomegalovirus(CMV) Antibody, IgG: POSITIVE — AB

## 2010-12-02 LAB — PROTHROMBIN GENE MUTATION

## 2010-12-02 LAB — PHOSPHORUS
Phosphorus: 2.8 mg/dL (ref 2.3–4.6)
Phosphorus: 3.2 mg/dL (ref 2.3–4.6)

## 2010-12-02 LAB — AFB CULTURE WITH SMEAR (NOT AT ARMC): Acid Fast Smear: NONE SEEN

## 2010-12-02 LAB — GIARDIA/CRYPTOSPORIDIUM SCREEN(EIA)
Cryptosporidium Screen (EIA): NEGATIVE
Giardia Screen - EIA: NEGATIVE

## 2010-12-02 LAB — OSMOLALITY, URINE: Osmolality, Ur: 562 mOsm/kg (ref 390–1090)

## 2010-12-02 LAB — VANCOMYCIN, RANDOM: Vancomycin Rm: 12 ug/mL

## 2010-12-02 LAB — HEPARIN LEVEL (UNFRACTIONATED)
Heparin Unfractionated: 0.36 IU/mL (ref 0.30–0.70)
Heparin Unfractionated: 0.43 IU/mL (ref 0.30–0.70)

## 2010-12-02 LAB — ANTITHROMBIN III: AntiThromb III Func: 84 % (ref 76–126)

## 2010-12-02 LAB — CULTURE, BLOOD (ROUTINE X 2): Culture: NO GROWTH

## 2010-12-02 LAB — ABO/RH: ABO/RH(D): O POS

## 2010-12-02 LAB — ANA: Anti Nuclear Antibody(ANA): NEGATIVE

## 2010-12-02 LAB — HSV(HERPES SIMPLEX VRS) I + II AB-IGG: Herpes Simplex Vrs I + II Ab, IgG: 11.1 IV — ABNORMAL HIGH

## 2010-12-02 LAB — PROTEIN S ACTIVITY: Protein S Activity: 39 % — ABNORMAL LOW (ref 69–129)

## 2010-12-02 LAB — HIV-1 GENOTYPR PLUS

## 2010-12-02 LAB — SEDIMENTATION RATE: Sed Rate: 13 mm/hr (ref 0–16)

## 2010-12-03 LAB — CSF CELL COUNT WITH DIFFERENTIAL
RBC Count, CSF: 1 /mm3 — ABNORMAL HIGH
Tube #: 4
WBC, CSF: 0 /mm3 (ref 0–5)

## 2010-12-03 LAB — T-HELPER CELLS (CD4) COUNT (NOT AT ARMC): CD4 % Helper T Cell: 5 % — ABNORMAL LOW (ref 33–55)

## 2010-12-03 LAB — CULTURE, BLOOD (ROUTINE X 2)
Culture: NO GROWTH
Report Status: 12202010

## 2010-12-03 LAB — DIFFERENTIAL
Eosinophils Absolute: 0.1 10*3/uL (ref 0.0–0.7)
Lymphs Abs: 1.7 10*3/uL (ref 0.7–4.0)
Neutrophils Relative %: 82 % — ABNORMAL HIGH (ref 43–77)

## 2010-12-03 LAB — HSV(HERPES SMPLX)ABS-I+II(IGG+IGM)-BLD: Herpes Simplex Vrs I + II Ab, IgG: 14 IV — ABNORMAL HIGH

## 2010-12-03 LAB — PROTEIN, CSF: Total  Protein, CSF: 76 mg/dL — ABNORMAL HIGH (ref 15–45)

## 2010-12-03 LAB — CBC
HCT: 42.9 % (ref 39.0–52.0)
Hemoglobin: 14 g/dL (ref 13.0–17.0)
MCHC: 32.7 g/dL (ref 30.0–36.0)
MCV: 87.4 fL (ref 78.0–100.0)
RDW: 16.2 % — ABNORMAL HIGH (ref 11.5–15.5)

## 2010-12-03 LAB — VARICELLA-ZOSTER BY PCR

## 2010-12-03 LAB — COMPREHENSIVE METABOLIC PANEL
ALT: 19 U/L (ref 0–53)
BUN: 9 mg/dL (ref 6–23)
CO2: 19 mEq/L (ref 19–32)
Calcium: 9.6 mg/dL (ref 8.4–10.5)
Creatinine, Ser: 1.24 mg/dL (ref 0.4–1.5)
GFR calc non Af Amer: >60 mL/min (ref >60)
Glucose, Bld: 175 mg/dL — ABNORMAL HIGH (ref 70–99)
Sodium: 138 mEq/L (ref 135–145)

## 2010-12-03 LAB — URINALYSIS, ROUTINE W REFLEX MICROSCOPIC
Bilirubin Urine: NEGATIVE
Glucose, UA: 100 mg/dL — AB
Ketones, ur: NEGATIVE mg/dL
Protein, ur: 100 mg/dL — AB

## 2010-12-03 LAB — CSF CULTURE W GRAM STAIN

## 2010-12-03 LAB — TSH: TSH: 1.333 u[IU]/mL (ref 0.350–4.500)

## 2010-12-03 LAB — GLUCOSE, CSF: Glucose, CSF: 77 mg/dL — ABNORMAL HIGH (ref 43–76)

## 2010-12-03 LAB — HIV-1 RNA QUANT-NO REFLEX-BLD
HIV 1 RNA Quant: 34000 copies/mL — ABNORMAL HIGH (ref <48)
HIV-1 RNA Quant, Log: 4.53 {Log} — ABNORMAL HIGH (ref <1.68)

## 2010-12-03 LAB — VDRL, CSF: VDRL Quant, CSF: NONREACTIVE

## 2010-12-03 LAB — TOXOPLASMA ANTIBODIES- IGG AND  IGM: Toxoplasma Antibody- IgM: <0.1 IV

## 2010-12-03 LAB — LIPASE, BLOOD: Lipase: 22 U/L (ref 11–59)

## 2010-12-16 LAB — T-HELPER CELL (CD4) - (RCID CLINIC ONLY): CD4 % Helper T Cell: 12 % — ABNORMAL LOW (ref 33–55)

## 2011-01-16 ENCOUNTER — Emergency Department (HOSPITAL_COMMUNITY)
Admission: EM | Admit: 2011-01-16 | Discharge: 2011-01-16 | Disposition: A | Discharge status: Home / Self Care | Payer: Medicaid Other | Attending: Emergency Medicine | Admitting: Emergency Medicine

## 2011-01-16 DIAGNOSIS — Z21 Asymptomatic human immunodeficiency virus [HIV] infection status: Secondary | ICD-10-CM | POA: Insufficient documentation

## 2011-01-16 DIAGNOSIS — Z8673 Personal history of transient ischemic attack (TIA), and cerebral infarction without residual deficits: Secondary | ICD-10-CM | POA: Insufficient documentation

## 2011-01-16 DIAGNOSIS — R5381 Other malaise: Secondary | ICD-10-CM | POA: Insufficient documentation

## 2011-01-16 DIAGNOSIS — Z79899 Other long term (current) drug therapy: Secondary | ICD-10-CM | POA: Insufficient documentation

## 2011-01-16 DIAGNOSIS — R42 Dizziness and giddiness: Secondary | ICD-10-CM | POA: Insufficient documentation

## 2011-01-16 DIAGNOSIS — Z7901 Long term (current) use of anticoagulants: Secondary | ICD-10-CM | POA: Insufficient documentation

## 2011-01-16 DIAGNOSIS — K219 Gastro-esophageal reflux disease without esophagitis: Secondary | ICD-10-CM | POA: Insufficient documentation

## 2011-01-16 DIAGNOSIS — I10 Essential (primary) hypertension: Secondary | ICD-10-CM | POA: Insufficient documentation

## 2011-01-16 LAB — BASIC METABOLIC PANEL
BUN: 7 mg/dL (ref 6–23)
CO2: 27 mEq/L (ref 19–32)
Chloride: 107 mEq/L (ref 96–112)
Glucose, Bld: 90 mg/dL (ref 70–99)
Potassium: 4.4 mEq/L (ref 3.5–5.1)

## 2011-01-16 LAB — CBC
HCT: 32.3 % — ABNORMAL LOW (ref 39.0–52.0)
Hemoglobin: 11 g/dL — ABNORMAL LOW (ref 13.0–17.0)
MCV: 95 fL (ref 78.0–100.0)
RBC: 3.4 MIL/uL — ABNORMAL LOW (ref 4.22–5.81)
RDW: 14.5 % (ref 11.5–15.5)
WBC: 11.2 10*3/uL — ABNORMAL HIGH (ref 4.0–10.5)

## 2011-01-16 LAB — DIFFERENTIAL
Eosinophils Relative: 3 % (ref 0–5)
Lymphocytes Relative: 36 % (ref 12–46)
Lymphs Abs: 4 10*3/uL (ref 0.7–4.0)
Neutro Abs: 6.1 10*3/uL (ref 1.7–7.7)

## 2011-04-22 IMAGING — CT CT ANGIO PELVIS
2 of 7 series · 15 of 46 positions shown, 17 images · IV contrast (APPLIED)
Comparison: None

CTA CHEST

CLINICAL DATA: Chest pain, abdominal pain, and loss of views of
the lower extremities.  Evaluate for aortic dissection. Patient
with Guillain Barre syndrome

CT ANGIOGRAPHY CHEST, ABDOMEN AND PELVIS
TECHNIQUE: Multidetector CT imaging through the chest, abdomen and
pelvis was performed using the standard protocol during bolus
administration of intravenous contrast.  Multiplanar reconstructed
images including MIPs were obtained and reviewed to evaluate the
vascular anatomy.
Contrast: 100 ml intravenous [id number]

[Series 6: dissection 2.0 st · axial · 0.71mm/px · z∈[+553,+1145]mm · 12 of 336 slices shown, 14 images]
[im 20/336  soft-tissue]
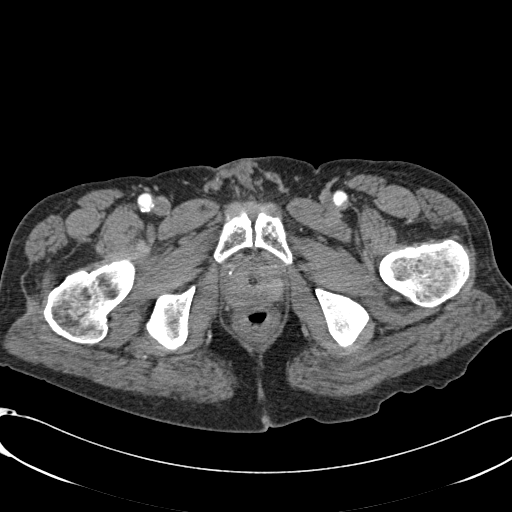
[im 20/336  bone]
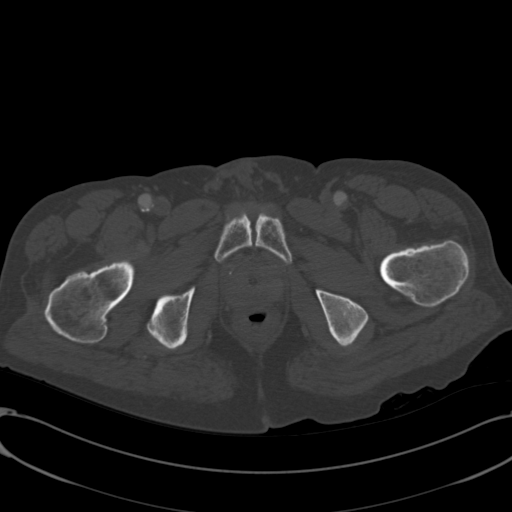
[im 60/336  soft-tissue]
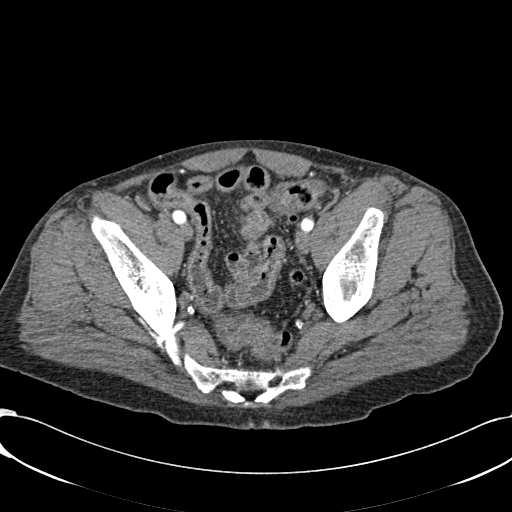
[im 79/336  soft-tissue]
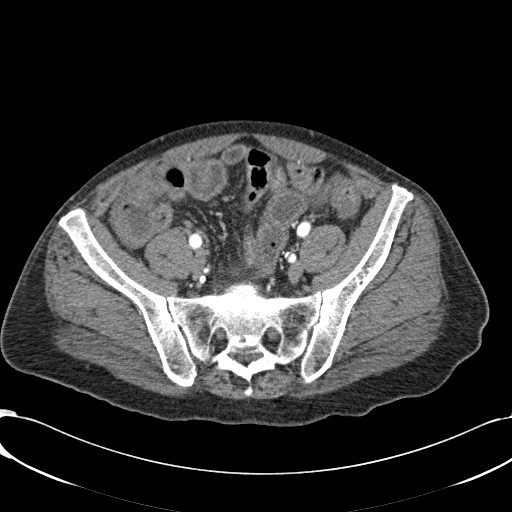
[im 99/336  soft-tissue]
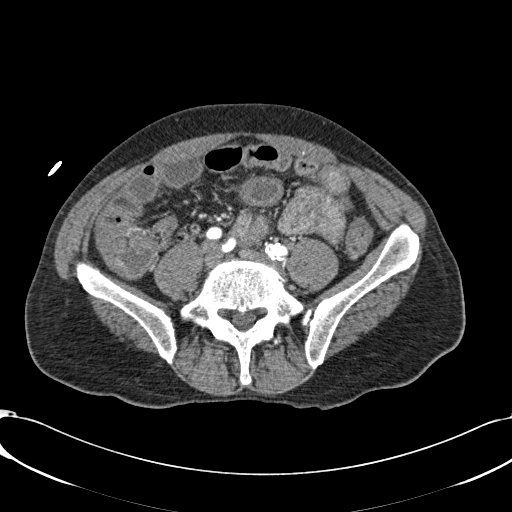
[im 138/336  soft-tissue]
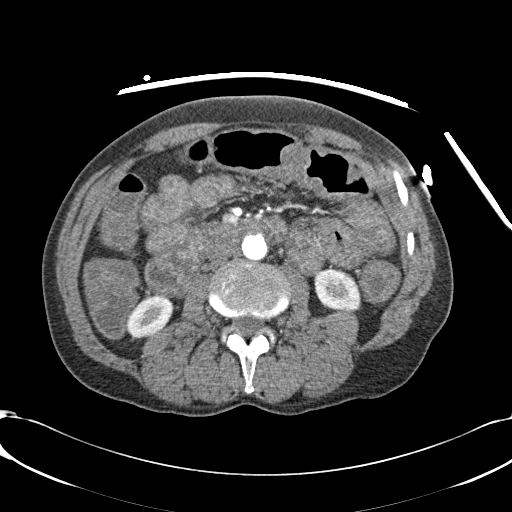
[im 158/336  soft-tissue]
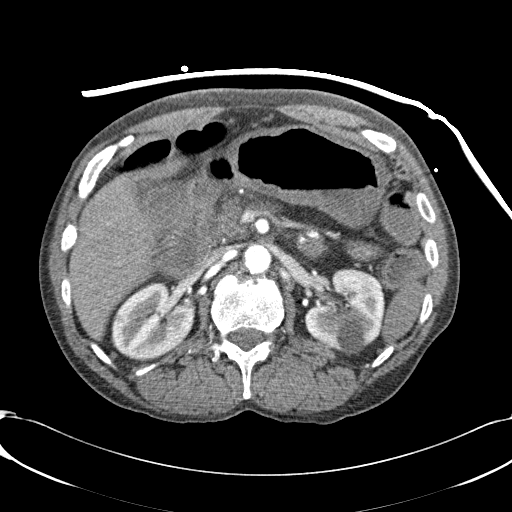
[im 178/336  soft-tissue]
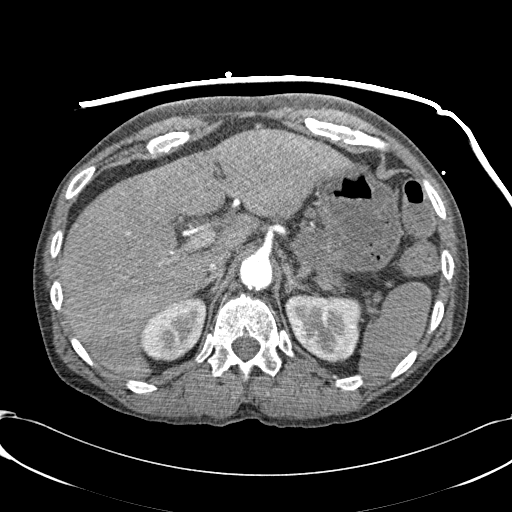
[im 217/336  soft-tissue]
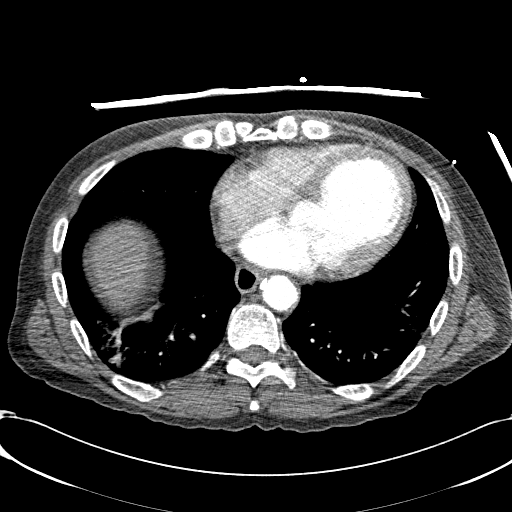
[im 237/336  soft-tissue]
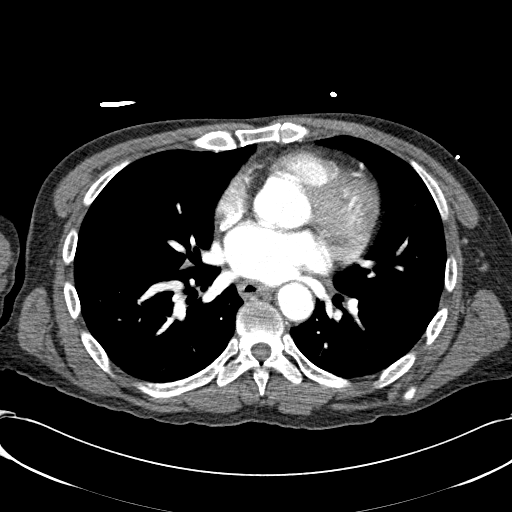
[im 237/336  bone]
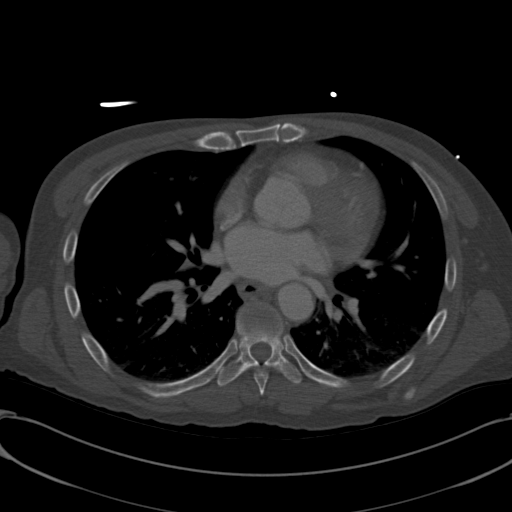
[im 257/336  soft-tissue]
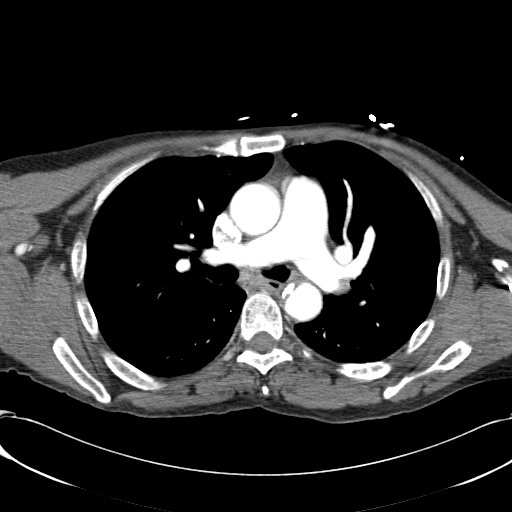
[im 296/336  soft-tissue]
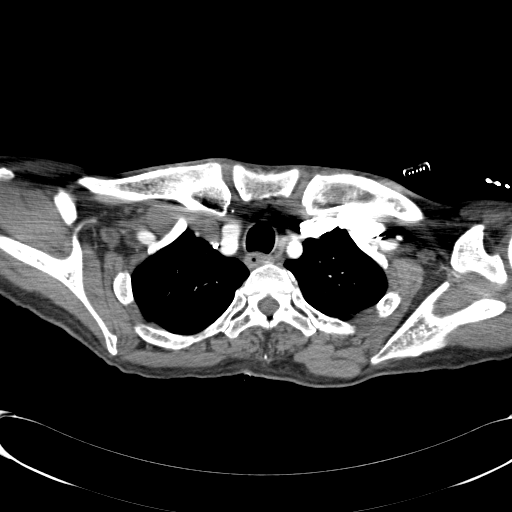
[im 316/336  soft-tissue]
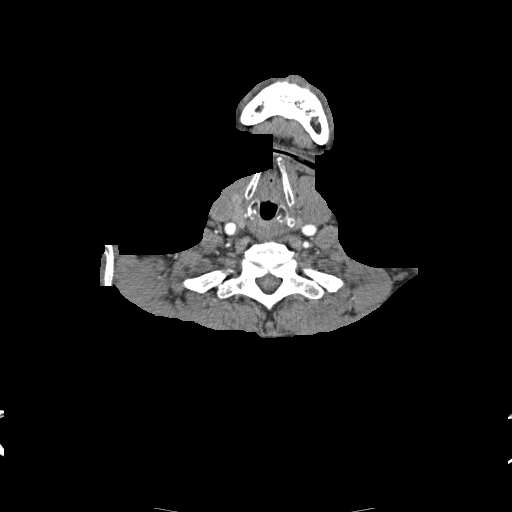

[Series 9: dissection 2.0 thins · coronal · 1.31mm/px · 3 of 123 slices shown]
[im 31/123  soft-tissue]
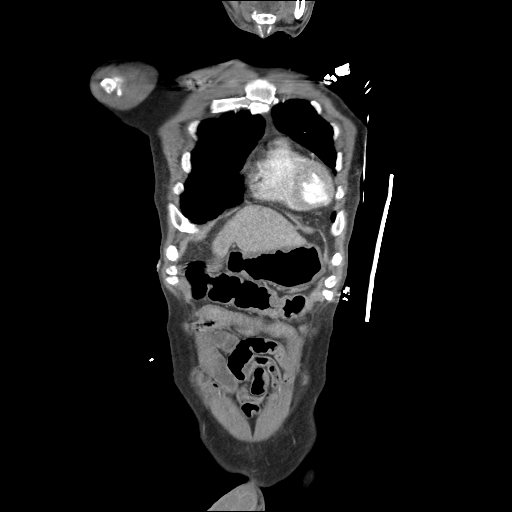
[im 62/123  soft-tissue]
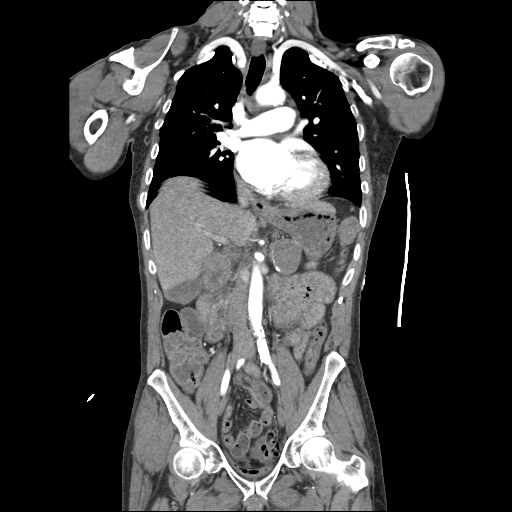
[im 92/123  soft-tissue]
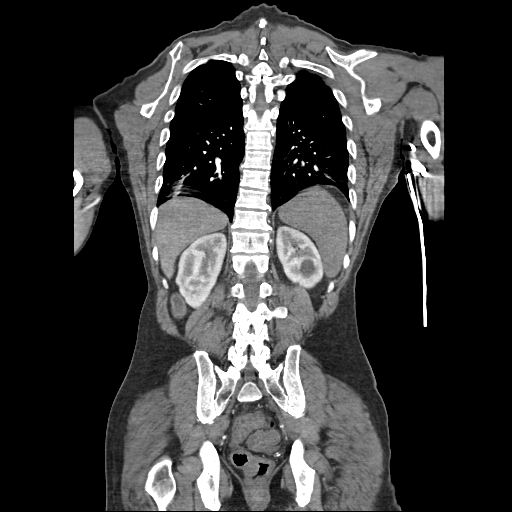

[15 of 46 positions shown; findings below may reference images not displayed]

FINDINGS: Mild aortic atherosclerotic calcifications are identified without
evidence of dissection.
The great vessels are within normal limits.  Mild cardiomegaly is
identified.
No definite pulmonary emboli are identified.
No pleural or pericardial effusions or enlarged lymph nodes noted

Focal opacity at the right lung base is noted - suspect atelectasis
but pneumonia is not excluded.
Mild left basilar airspace disease/ground-glass opacity is noted
which may represent atelectasis, pneumonia/aspiration, or
inflammation.

The lungs are otherwise clear.
No acute or suspicious bony abnormalities are noted.
Review of the MIP images confirms the above findings.
IMPRESSION: No evidence of thoracic aortic aneurysm or dissection.

Bibasilar pulmonary opacities as described.  Pneumonia is not
excluded.

CTA ABDOMEN
FINDINGS: Mild abdominal aortic atherosclerotic plaque and
calcifications are noted without evidence of aneurysm or
dissection.
The mesenteric vessels are patent and within normal limits.

The liver, spleen, adrenal glands, and kidneys are unremarkable
except for a left renal cyst.

Cholelithiasis noted with apparent mild gallbladder wall
thickening.  Acute cholecystitis is not excluded and consider
ultrasound or nuclear medicine study for further evaluation.

A 4 cm rim calcified rounded lesion within the pancreatic tail does
not exhibit enhancement and likely represents a pseudocyst.
Calcifications within the remainder of the pancreas is noted.
No evidence of free fluid, enlarged lymph nodes, or biliary
dilatation.

There is a suggestion of mild diffuse small bowel and colonic wall
thickening.  An enteritis/colitis is difficult to exclude given
these findings but correlate clinically.
Review of the MIP images confirms the above findings.
IMPRESSION: No evidence of abdominal aortic aneurysm or dissection.

Cholelithiasis with apparent gallbladder wall thickening.  Acute
cholecystitis is not excluded and consider ultrasound or nuclear
medicine study as clinically indicated.

Equivocal mild small bowel and colonic wall thickening.  This may
be secondary to protein state or enteritis/colitis.  Correlate
clinically.

4 cm nonenhancing rim calcified pancreatic lesion - likely
pseudocyst.

CTA PELVIS
FINDINGS: Mild to moderate atherosclerotic plaquing calcification
of both common iliac arteries are noted.  There is no evidence of
iliac artery dissection or aneurysm.
No evidence of free fluid or enlarged lymph nodes noted.
Colonic diverticulosis noted without evidence of diverticulitis.

Again equivocal small bowel and colonic wall thickening is noted.
A trace amount of free fluid in the pelvis is identified.
No acute or suspicious bony abnormalities are identified.
A Foley catheter and gas in the bladder is noted.
Review of the MIP images confirms the above findings.
IMPRESSION: No evidence of iliac artery aneurysm or dissection.

Equivocal mild small bowel and colonic wall thickening.  This may
be secondary to protein state or enteritis/colitis.  Correlate
clinically.

Trace amount of free pelvic fluid.

## 2011-10-15 ENCOUNTER — Telehealth: Payer: Self-pay | Admitting: *Deleted

## 2011-10-15 NOTE — Telephone Encounter (Signed)
Spoke with patient's sister, she is listed as his emergency contact and she said he is in a nursing home and they are suppose to arrange his doctor appointments. Facility is [facility] [phone].  Spoke with his nurse and she will request labs be ordered at the facility and will call the clinic back to arrange a f/u visit.  Patient last seen 07/2010. Kent Barnes CMA

## 2011-12-02 ENCOUNTER — Telehealth: Payer: Self-pay | Admitting: Licensed Clinical Social Worker

## 2011-12-02 NOTE — Telephone Encounter (Signed)
Dois Davenport from [facility] called stating that the patient is scheduled to come in for labs tomorrow and would like to know the names of the labs so they could draw them there. I called back and left a message on her voicemail for her to call me. Cd4, Viral load, CMP, and cbc can be drawn and faxed to [place] if the patient can't make the lab visit.

## 2011-12-03 ENCOUNTER — Other Ambulatory Visit: Payer: Medicaid Other

## 2011-12-17 ENCOUNTER — Ambulatory Visit: Payer: Medicaid Other | Admitting: Infectious Diseases

## 2011-12-17 ENCOUNTER — Ambulatory Visit (INDEPENDENT_AMBULATORY_CARE_PROVIDER_SITE_OTHER): Payer: Medicaid Other | Admitting: Infectious Diseases

## 2011-12-17 ENCOUNTER — Encounter: Payer: Self-pay | Admitting: Infectious Diseases

## 2011-12-17 VITALS — BP 122/74 | HR 90 | Temp 98.1°F | Ht 72.0 in | Wt 172.0 lb

## 2011-12-17 DIAGNOSIS — B20 Human immunodeficiency virus [HIV] disease: Secondary | ICD-10-CM

## 2011-12-17 DIAGNOSIS — F528 Other sexual dysfunction not due to a substance or known physiological condition: Secondary | ICD-10-CM

## 2011-12-17 LAB — LIPID PANEL
HDL: 29 mg/dL — ABNORMAL LOW (ref >39)
LDL Cholesterol: 69 mg/dL (ref 0–99)
Total CHOL/HDL Ratio: 5 Ratio
Triglycerides: 233 mg/dL — ABNORMAL HIGH (ref <150)
VLDL: 47 mg/dL — ABNORMAL HIGH (ref 0–40)

## 2011-12-17 MED ORDER — SILDENAFIL CITRATE 25 MG PO TABS: 25.0000 mg | ORAL_TABLET | Freq: Every day | ORAL | Status: DC | PRN

## 2011-12-17 MED ORDER — RITONAVIR 100 MG PO TABS: 100.0000 mg | ORAL_TABLET | Freq: Every day | ORAL | Status: DC

## 2011-12-17 NOTE — Progress Notes (Signed)
  Subjective:    Patient ID: Kent Barnes, male    DOB: 31-Dec-1955, 56 y.o.   MRN: [id number]  HPI 56 yo M with hx of HIV+ since 1993. First seen in ID clinic 2005 and was previously on ATVr/TRV through the [facility]. Geno negative Jan 05, 2006 (due to detectable virus).  He was lost to f/u for several years and then adm to [facility] 08-15-09 to 10-11-09 with multiple emboli. 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 He then developed upper abd fluid collections (? abscesses). He was not a surgical candidate and he was started on coumadin. His course was also complicated by C diff. His CD4 in hospital was 30 and VL was 34,000 (geno K103N). repeated 09-19-09 VL 251, CD4 230. He was started on atripla.  Genotype 10-29-09 K103N,M184V,P225H  Re-adm to hospital 6-7 to 02-15-10 and found to have renal failure, acidosis, anemia (colon negative, EGD Abnormal area in the proximal stomach representing a small mass versus scar tissue as well as submucosal lesion approximately 1 cm diameter in esophagus, probable lipoma. Pathology results revealed chronic active gastritis with ulceration and intestinal metaplasia. No evidence of dysplasia or malignancy.).  His ART was stopped. Cr 1.37 (02-18-10). On return 03-18-10 and was started on CBV/ISN/DRVr.  Last ID visit 10-2010. He has been staying at an assisted living facility.  Has been feeling well. States he has been asked to start to prepare to move onto his own. No problems with ART.  He had blood work done showing H/H of 10.4/31.2, otherwise all normal. He had VL of <20, CD4 474. Asks for viagra    Review of Systems  Constitutional: Negative for appetite change and unexpected weight change.  Respiratory: Negative for cough and shortness of breath.   Gastrointestinal: Negative for diarrhea,  constipation and blood in stool.  Genitourinary: Negative for dysuria.       Objective:   Physical Exam  Constitutional: He appears well-developed and well-nourished.  HENT:  Mouth/Throat: No oropharyngeal exudate.  Eyes: EOM are normal. Pupils are equal, round, and reactive to light.  Neck: Neck supple.  Cardiovascular: Normal rate, regular rhythm and normal heart sounds.   Pulmonary/Chest: Effort normal and breath sounds normal.  Abdominal: Soft. Bowel sounds are normal. He exhibits no distension.  Lymphadenopathy:    He has no cervical adenopathy.          Assessment & Plan:

## 2011-12-17 NOTE — Assessment & Plan Note (Signed)
Will cont his current rx. He asks about qd single pill but his resistance prevents this. i explained this to him. Will send him to lab for RPR and lipid panel. His notes list RTV as BID, will change this to qday.  Will see him back in 6 months. He is given condoms.

## 2011-12-17 NOTE — Progress Notes (Signed)
Addended by: Chase Knebel C on: 12/17/2011 09:57 AM   Modules accepted: Orders

## 2011-12-17 NOTE — Assessment & Plan Note (Addendum)
Pt asks for viagra. wil rx with instructions for pt not to use more than every 48 hours, call MD if erection more than 6 hours. Stop if chest pain and go to ed.

## 2011-12-18 LAB — HEPATITIS A ANTIBODY, TOTAL: Hep A Total Ab: NEGATIVE

## 2011-12-18 LAB — HEPATITIS B CORE ANTIBODY, TOTAL: Hep B Core Total Ab: NEGATIVE

## 2012-06-02 ENCOUNTER — Other Ambulatory Visit: Payer: Medicaid Other

## 2012-06-03 ENCOUNTER — Telehealth: Payer: Self-pay | Admitting: *Deleted

## 2012-06-03 NOTE — Telephone Encounter (Signed)
New lab Appt made.

## 2012-06-04 ENCOUNTER — Other Ambulatory Visit: Payer: Medicaid Other

## 2012-06-04 DIAGNOSIS — B20 Human immunodeficiency virus [HIV] disease: Secondary | ICD-10-CM

## 2012-06-04 LAB — COMPREHENSIVE METABOLIC PANEL
ALT: 14 U/L (ref 0–53)
CO2: 23 mEq/L (ref 19–32)
Calcium: 8.9 mg/dL (ref 8.4–10.5)
Chloride: 110 mEq/L (ref 96–112)
Glucose, Bld: 95 mg/dL (ref 70–99)
Sodium: 141 mEq/L (ref 135–145)
Total Protein: 6.5 g/dL (ref 6.0–8.3)

## 2012-06-04 LAB — T-HELPER CELL (CD4) - (RCID CLINIC ONLY)
CD4 % Helper T Cell: 13 % — ABNORMAL LOW (ref 33–55)
CD4 T Cell Abs: 430 uL (ref 400–2700)

## 2012-06-04 LAB — CBC
Hemoglobin: 10.9 g/dL — ABNORMAL LOW (ref 13.0–17.0)
MCV: 102.9 fL — ABNORMAL HIGH (ref 78.0–100.0)
Platelets: 293 10*3/uL (ref 150–400)
RBC: 3.14 MIL/uL — ABNORMAL LOW (ref 4.22–5.81)
WBC: 7.3 10*3/uL (ref 4.0–10.5)

## 2012-06-06 LAB — HIV-1 RNA QUANT-NO REFLEX-BLD: HIV 1 RNA Quant: <20 copies/mL (ref <20)

## 2012-06-16 ENCOUNTER — Ambulatory Visit: Payer: Medicaid Other | Admitting: Infectious Diseases

## 2012-06-23 ENCOUNTER — Ambulatory Visit: Payer: Medicaid Other | Admitting: Infectious Diseases

## 2012-06-30 ENCOUNTER — Ambulatory Visit (INDEPENDENT_AMBULATORY_CARE_PROVIDER_SITE_OTHER): Payer: Medicaid Other | Admitting: Infectious Diseases

## 2012-06-30 ENCOUNTER — Encounter: Payer: Self-pay | Admitting: Infectious Diseases

## 2012-06-30 VITALS — BP 113/66 | HR 111 | Temp 98.0°F | Ht 72.0 in | Wt 153.0 lb

## 2012-06-30 DIAGNOSIS — B20 Human immunodeficiency virus [HIV] disease: Secondary | ICD-10-CM

## 2012-06-30 DIAGNOSIS — Z23 Encounter for immunization: Secondary | ICD-10-CM

## 2012-06-30 DIAGNOSIS — Z79899 Other long term (current) drug therapy: Secondary | ICD-10-CM

## 2012-06-30 DIAGNOSIS — Z113 Encounter for screening for infections with a predominantly sexual mode of transmission: Secondary | ICD-10-CM

## 2012-06-30 DIAGNOSIS — F17200 Nicotine dependence, unspecified, uncomplicated: Secondary | ICD-10-CM

## 2012-06-30 NOTE — Assessment & Plan Note (Signed)
Encouraged him to quit smoking.  

## 2012-06-30 NOTE — Progress Notes (Signed)
  Subjective:    Patient ID: Kent Barnes, male    DOB: 1956/04/01, 56 y.o.   MRN: [id number]  HPI 56 yo M with hx of HIV+ since 1993. First seen in ID clinic 2005 and was previously on ATVr/TRV through the [facility]. Geno negative Jan 05, 2006 (due to detectable virus).  He was lost to f/u for several years and then adm to [facility] 08-15-09 to 10-11-09 with multiple emboli. 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 He then developed upper abd fluid collections (? abscesses). He was not a surgical candidate and he was started on coumadin. His course was also complicated by C diff. His CD4 in hospital was 30 and VL was 34,000 (geno K103N). repeated 09-19-09 VL 251, CD4 230. He was started on atripla.  Genotype 10-29-09 K103N,M184V,P225H  Re-adm to hospital 6-7 to 02-15-10 and found to have renal failure, acidosis, anemia (colon negative, EGD Abnormal area in the proximal stomach representing a small mass versus scar tissue as well as submucosal lesion approximately 1 cm diameter in esophagus, probable lipoma. Pathology results revealed chronic active gastritis with ulceration and intestinal metaplasia. No evidence of dysplasia or malignancy.).  His ART was stopped. Cr 1.37 (02-18-10). On return 03-18-10 and was started on CBV/ISN/DRVr.  He has been staying at an assisted living facility.  HIV 1 RNA Quant (copies/mL)  Date Value  06/04/2012 <20   07/30/2010 <20 copies/mL   02/06/2010 <48      CD4 T Cell Abs (cmm)  Date Value  06/04/2012 430   07/30/2010 280*  02/05/2010 210*    Has been feeling well. hsa been making his own bed, takes his own shower, only has to wait for his meals to come.  Wt down 19# from previous but up 40# from 2011. Has had good appetite. Walks with a walker.     Review of Systems  Constitutional: Negative for  unexpected weight change.  Gastrointestinal: Negative for diarrhea and constipation.  Genitourinary: Negative for difficulty urinating.  Neurological: Negative for weakness.       Objective:   Physical Exam  Constitutional: He appears well-developed and well-nourished.  HENT:  Mouth/Throat: No oropharyngeal exudate.  Eyes: EOM are normal. Pupils are equal, round, and reactive to light.  Neck: Neck supple.  Cardiovascular: Normal rate, regular rhythm and normal heart sounds.   Pulmonary/Chest: Effort normal and breath sounds normal.  Abdominal: Soft. Bowel sounds are normal. He exhibits no distension.  Musculoskeletal: He exhibits no edema.  Lymphadenopathy:    He has no cervical adenopathy.  Neurological:       Strength is 5/5 UE and LE. Some delay in RLE initially.           Assessment & Plan:

## 2012-06-30 NOTE — Assessment & Plan Note (Signed)
He is doing very well. His CD4 and VL are normal. His wt is encouraging. He is getting a significant benefit from his SNF, thinks the admin wants him out. Will see him back in 6 months with labs. Gets flu shot today

## 2012-12-27 ENCOUNTER — Other Ambulatory Visit: Payer: Medicaid Other

## 2013-01-12 ENCOUNTER — Ambulatory Visit: Payer: Medicaid Other | Admitting: Infectious Diseases

## 2013-02-07 ENCOUNTER — Ambulatory Visit: Payer: Medicaid Other | Admitting: Infectious Diseases

## 2013-11-01 ENCOUNTER — Other Ambulatory Visit: Payer: Medicaid Other

## 2013-11-11 ENCOUNTER — Telehealth: Payer: Self-pay | Admitting: *Deleted

## 2013-11-11 NOTE — Telephone Encounter (Signed)
Pt is returning to care, missed lab appointment 11/01/13.  Pt is scheduled with Dr. Ninetta LightsHatcher 11/21/13, encouraged to have labs drawn next.  Pt given appointment for 3/17.  Pt is participating in "Back to Basics" where he has a provider come to him for medical care, has a prescription for labs to be drawn.  RN encouraged him to bring that script in on Tuesday. Andree CossHowell, Gertrue Willette M, RN

## 2013-11-15 ENCOUNTER — Other Ambulatory Visit: Payer: Medicaid Other

## 2013-11-21 ENCOUNTER — Other Ambulatory Visit: Payer: Self-pay | Admitting: *Deleted

## 2013-11-21 ENCOUNTER — Encounter: Payer: Self-pay | Admitting: Infectious Diseases

## 2013-11-21 ENCOUNTER — Ambulatory Visit (INDEPENDENT_AMBULATORY_CARE_PROVIDER_SITE_OTHER): Payer: Medicaid Other | Admitting: Infectious Diseases

## 2013-11-21 VITALS — BP 129/83 | HR 86 | Temp 97.8°F | Ht 73.0 in | Wt 125.0 lb

## 2013-11-21 DIAGNOSIS — Z113 Encounter for screening for infections with a predominantly sexual mode of transmission: Secondary | ICD-10-CM

## 2013-11-21 DIAGNOSIS — Z79899 Other long term (current) drug therapy: Secondary | ICD-10-CM

## 2013-11-21 DIAGNOSIS — G8220 Paraplegia, unspecified: Secondary | ICD-10-CM

## 2013-11-21 DIAGNOSIS — B20 Human immunodeficiency virus [HIV] disease: Secondary | ICD-10-CM

## 2013-11-21 LAB — COMPREHENSIVE METABOLIC PANEL
ALBUMIN: 3.6 g/dL (ref 3.5–5.2)
ALT: 12 U/L (ref 0–53)
AST: 21 U/L (ref 0–37)
Alkaline Phosphatase: 110 U/L (ref 39–117)
BUN: 6 mg/dL (ref 6–23)
CALCIUM: 9 mg/dL (ref 8.4–10.5)
CO2: 27 mEq/L (ref 19–32)
CREATININE: 0.57 mg/dL (ref 0.50–1.35)
Chloride: 106 mEq/L (ref 96–112)
Glucose, Bld: 66 mg/dL — ABNORMAL LOW (ref 70–99)
POTASSIUM: 4.5 meq/L (ref 3.5–5.3)
Sodium: 138 mEq/L (ref 135–145)
Total Bilirubin: 0.6 mg/dL (ref 0.2–1.2)
Total Protein: 6.9 g/dL (ref 6.0–8.3)

## 2013-11-21 LAB — CBC
HCT: 34.6 % — ABNORMAL LOW (ref 39.0–52.0)
Hemoglobin: 11.1 g/dL — ABNORMAL LOW (ref 13.0–17.0)
MCH: 30.6 pg (ref 26.0–34.0)
MCHC: 32.1 g/dL (ref 30.0–36.0)
MCV: 95.3 fL (ref 78.0–100.0)
PLATELETS: 404 10*3/uL — AB (ref 150–400)
RBC: 3.63 MIL/uL — ABNORMAL LOW (ref 4.22–5.81)
RDW: 17.1 % — AB (ref 11.5–15.5)
WBC: 6.4 10*3/uL (ref 4.0–10.5)

## 2013-11-21 LAB — VITAMIN B12: Vitamin B-12: 684 pg/mL (ref 211–911)

## 2013-11-21 LAB — HEMOGLOBIN A1C
Hgb A1c MFr Bld: 4.7 % (ref <5.7)
Mean Plasma Glucose: 88 mg/dL (ref <117)

## 2013-11-21 LAB — LIPID PANEL
CHOLESTEROL: 96 mg/dL (ref 0–200)
HDL: 48 mg/dL (ref >39)
LDL Cholesterol: 38 mg/dL (ref 0–99)
TRIGLYCERIDES: 48 mg/dL (ref <150)
Total CHOL/HDL Ratio: 2 Ratio
VLDL: 10 mg/dL (ref 0–40)

## 2013-11-21 LAB — TSH: TSH: 1.44 u[IU]/mL (ref 0.350–4.500)

## 2013-11-21 MED ORDER — LAMIVUDINE-ZIDOVUDINE 150-300 MG PO TABS: 1.0000 | ORAL_TABLET | Freq: Two times a day (BID) | ORAL | Status: DC

## 2013-11-21 NOTE — Progress Notes (Signed)
   Subjective:    Patient ID: Kent Barnes, male    DOB: 19-Feb-1956, 58 y.o.   MRN: [id number]  HPI 58 yo M with hx of HIV+ since 1993. First seen in ID clinic 2005 and was previously on ATVr/TRV through the [facility].  He was lost to f/u for several years and then adm to [facility] 08-15-09 to 10-11-09 with multiple emboli. 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 He then developed upper abd fluid collections (? abscesses). He was not a surgical candidate and he was started on coumadin. His course was also complicated by C diff. His CD4 in hospital was 30 and VL was 34,000 (geno K103N). repeated 09-19-09 VL 251, CD4 230. He was started on atripla.  Genotype 10-29-09 K103N,M184V,P225H  Re-adm to hospital 6-7 to 02-15-10 and found to have renal failure, acidosis, anemia (colon negative, EGD Abnormal area in the proximal stomach representing a small mass versus scar tissue as well as submucosal lesion approximately 1 cm diameter in esophagus, probable lipoma. Pathology results revealed chronic active gastritis with ulceration and intestinal metaplasia. No evidence of dysplasia or malignancy.).  His ART was stopped. Cr 1.37 (02-18-10). On return 03-18-10 and was started on CBV/ISN/DRVr. He had been staying at an assisted living facility, now has own apt in [place].  He has not been seen in ID for ~ 2 years now.  He was seen by his PCP, was off his ART for ~ 2months. He was restarted on DRVr/ISN for last 2 weeks (should also be on CBV).   HIV 1 RNA Quant (copies/mL)  Date Value  06/04/2012 <20   07/30/2010 <20 copies/mL   02/06/2010 <48      CD4 T Cell Abs (cmm)  Date Value  06/04/2012 430   07/30/2010 280*  02/05/2010 210*   Has lost ~ 47# since 2013. Having L ear popping and then pain.  Has been walking with a walker. Has not fallen.    Review of Systems  Constitutional: Positive for unexpected weight change. Negative for appetite change.  Gastrointestinal: Negative for diarrhea and constipation.  Genitourinary: Negative for difficulty urinating.       Objective:   Physical Exam  Constitutional: He appears well-developed and well-nourished.  HENT:  Ears:  Mouth/Throat: No oropharyngeal exudate.  Eyes: EOM are normal. Pupils are equal, round, and reactive to light.  Neck: Neck supple.  Cardiovascular: Normal rate, regular rhythm and normal heart sounds.   Pulmonary/Chest: Effort normal and breath sounds normal.  Abdominal: Soft. Bowel sounds are normal. He exhibits no distension. There is no tenderness.  Lymphadenopathy:    He has no cervical adenopathy.          Assessment & Plan:

## 2013-11-21 NOTE — Assessment & Plan Note (Signed)
Will get him re-established with [facility]. He is offered/refuses condoms. Will get him back on CBV, will check his INI genotype. Will see him back in 2-3 weeks to re-assess his meds and genotype. Will check his Hep B S Ab.

## 2013-11-21 NOTE — Addendum Note (Signed)
Addended by: Mariea ClontsGREEN, Gaspar Fowle D on: 11/21/2013 10:58 AM   Modules accepted: Orders

## 2013-11-21 NOTE — Assessment & Plan Note (Signed)
He appears to be improving, gaining some strength and mobility.

## 2013-11-22 LAB — HEPATITIS B SURFACE ANTIBODY,QUALITATIVE: Hep B S Ab: NEGATIVE

## 2013-11-22 LAB — HIV-1 RNA ULTRAQUANT REFLEX TO GENTYP+
HIV 1 RNA QUANT: 81 {copies}/mL — AB (ref <20)
HIV-1 RNA QUANT, LOG: 1.91 {Log} — AB (ref <1.30)

## 2013-11-22 LAB — T-HELPER CELL (CD4) - (RCID CLINIC ONLY)
CD4 % Helper T Cell: 15 % — ABNORMAL LOW (ref 33–55)
CD4 T Cell Abs: 420 /uL (ref 400–2700)

## 2013-11-22 LAB — RPR

## 2013-11-27 LAB — HLA B*5701: HLA-B*5701 w/rflx HLA-B High: NEGATIVE

## 2013-11-29 LAB — HIV-1 INTEGRASE GENOTYPE

## 2013-12-12 ENCOUNTER — Encounter: Payer: Self-pay | Admitting: Infectious Diseases

## 2013-12-12 ENCOUNTER — Ambulatory Visit (INDEPENDENT_AMBULATORY_CARE_PROVIDER_SITE_OTHER): Payer: Medicaid Other | Admitting: Infectious Diseases

## 2013-12-12 ENCOUNTER — Other Ambulatory Visit: Payer: Self-pay | Admitting: *Deleted

## 2013-12-12 VITALS — BP 132/67 | HR 89 | Temp 98.2°F | Ht 72.0 in | Wt 137.0 lb

## 2013-12-12 DIAGNOSIS — B20 Human immunodeficiency virus [HIV] disease: Secondary | ICD-10-CM

## 2013-12-12 DIAGNOSIS — Z23 Encounter for immunization: Secondary | ICD-10-CM

## 2013-12-12 DIAGNOSIS — IMO0002 Reserved for concepts with insufficient information to code with codable children: Secondary | ICD-10-CM

## 2013-12-12 MED ORDER — ALPRAZOLAM 0.5 MG PO TABS: 0.5000 mg | ORAL_TABLET | Freq: Three times a day (TID) | ORAL | Status: DC | PRN

## 2013-12-12 MED ORDER — ALPRAZOLAM 0.5 MG PO TABS: 0.5000 mg | ORAL_TABLET | Freq: Three times a day (TID) | ORAL | Status: AC | PRN

## 2013-12-12 NOTE — Assessment & Plan Note (Signed)
He is doing much better, living independently.

## 2013-12-12 NOTE — Assessment & Plan Note (Signed)
He is doing well. He is not undetectable but clinically well. Offered/refuses condoms. Next PNVX at 58 yo. Hep B serologies negative this month- will restart series. rtc 6 months.

## 2013-12-12 NOTE — Addendum Note (Signed)
Addended by: Rayli Wiederhold C on: 12/12/2013 10:55 AM   Modules accepted: Orders

## 2013-12-12 NOTE — Addendum Note (Signed)
Addended by: Wendall MolaOCKERHAM, Benito Lemmerman A on: 12/12/2013 12:51 PM   Modules accepted: Orders

## 2013-12-12 NOTE — Progress Notes (Signed)
   Subjective:    Patient ID: Kent Barnes, male    DOB: 26-Mar-1956, 58 y.o.   MRN: [id number]  HPI 58 yo M with hx of HIV+ since 1993. First seen in ID clinic 2005 and was previously on ATVr/TRV through the [facility].  He was lost to f/u for several years and then [facility] to 10-11-09 with multiple emboli. 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 He then developed upper abd fluid collections (? abscesses). He was not a surgical candidate and he was started on coumadin. His course was also complicated by C diff. His CD4 in hospital was 30 and VL was 34,000 (geno K103N). repeated 09-19-09 VL 251, CD4 230. He was started on atripla.  Genotype 10-29-09 K103N,M184V,P225H  Re-adm to hospital 6-7 to 02-15-10 and found to have renal failure, acidosis, anemia (colon negative, EGD Abnormal area in the proximal stomach representing a small mass versus scar tissue as well as submucosal lesion approximately 1 cm diameter in esophagus, probable lipoma. Pathology results revealed chronic active gastritis with ulceration and intestinal metaplasia. No evidence of dysplasia or malignancy.).  His ART was stopped. Cr 1.37 (02-18-10). On return 03-18-10 and was started on CBV/ISN/DRVr. He had been staying at an assisted living facility, now has own apt in [place].  Last seen in ID 11-21-13 after being out of care for 2 yrs. He was restarted on DRVr/ISN for last 2 weeks (should also be on CBV).  Has been doing well- living alone, getting around well. Has been gaining wt, "eating everything in site".  Denies missed meds.    HIV 1 RNA Quant (copies/mL)  Date Value  11/21/2013 81*  06/04/2012 <20   07/30/2010 <20 copies/mL      CD4 T Cell Abs (/uL)  Date Value  11/21/2013 420   06/04/2012 430   07/30/2010 280*    Review of Systems    Constitutional: Negative for appetite change and unexpected weight change.  Gastrointestinal: Negative for diarrhea and constipation.  Genitourinary: Negative for difficulty urinating.       Objective:   Physical Exam  Constitutional: He appears well-developed and well-nourished.  HENT:  Mouth/Throat: No oropharyngeal exudate.  Eyes: EOM are normal. Pupils are equal, round, and reactive to light.  Neck: Neck supple.  Cardiovascular: Normal rate, regular rhythm and normal heart sounds.   Pulmonary/Chest: Effort normal and breath sounds normal.  Abdominal: Soft. Bowel sounds are normal. He exhibits no distension. There is no tenderness.  Musculoskeletal: He exhibits no edema.  Lymphadenopathy:    He has no cervical adenopathy.          Assessment & Plan:

## 2013-12-27 ENCOUNTER — Telehealth: Payer: Self-pay | Admitting: *Deleted

## 2013-12-27 NOTE — Telephone Encounter (Signed)
Release of records received from PCP, Marletta LorJulie Barr and office note and labs faxed. Wendall MolaJacqueline Lakela Kuba

## 2014-01-05 ENCOUNTER — Other Ambulatory Visit: Payer: Self-pay | Admitting: Infectious Diseases

## 2014-01-11 ENCOUNTER — Ambulatory Visit (INDEPENDENT_AMBULATORY_CARE_PROVIDER_SITE_OTHER): Payer: Medicaid Other | Admitting: *Deleted

## 2014-01-11 ENCOUNTER — Telehealth: Payer: Self-pay | Admitting: *Deleted

## 2014-01-11 DIAGNOSIS — B20 Human immunodeficiency virus [HIV] disease: Secondary | ICD-10-CM

## 2014-01-11 DIAGNOSIS — Z23 Encounter for immunization: Secondary | ICD-10-CM

## 2014-01-11 NOTE — Telephone Encounter (Signed)
Patient was in today to have his #2 Hep b and wanted to ask the doctor about a medication for Erectile Dysfunction. He advised he is having a hard time maintaining an erection. He states he can get one just can not keep it. He wants something called in but he does not want Viagra any of the other medication for the problem is fine. Advised the patient will have to check with Dr Ninetta LightsHatcher and give him a call back once I get an answer.

## 2014-01-17 ENCOUNTER — Telehealth: Payer: Self-pay | Admitting: *Deleted

## 2014-01-17 ENCOUNTER — Other Ambulatory Visit: Payer: Self-pay | Admitting: Infectious Diseases

## 2014-01-17 DIAGNOSIS — B20 Human immunodeficiency virus [HIV] disease: Secondary | ICD-10-CM

## 2014-01-17 MED ORDER — LAMIVUDINE-ZIDOVUDINE 150-300 MG PO TABS: 1.0000 | ORAL_TABLET | Freq: Two times a day (BID) | ORAL | Status: DC

## 2014-01-17 MED ORDER — DARUNAVIR ETHANOLATE 800 MG PO TABS: 800.0000 mg | ORAL_TABLET | Freq: Every day | ORAL | Status: DC

## 2014-01-17 MED ORDER — RALTEGRAVIR POTASSIUM 400 MG PO TABS: 400.0000 mg | ORAL_TABLET | Freq: Two times a day (BID) | ORAL | Status: DC

## 2014-01-17 MED ORDER — RITONAVIR 100 MG PO TABS: 100.0000 mg | ORAL_TABLET | Freq: Every day | ORAL | Status: DC

## 2014-01-17 NOTE — Progress Notes (Signed)
Patient ID: Kent Barnes, male   DOB: August 13, 1956, 58 y.o.   MRN: [id number] meds refilled

## 2014-01-17 NOTE — Telephone Encounter (Signed)
Need MD Confirm current HIV regimen.  Will send regimen information to [facility] [address]  MD please see HIV rxes listed for the pt.

## 2014-01-18 ENCOUNTER — Other Ambulatory Visit: Payer: Self-pay | Admitting: *Deleted

## 2014-01-18 DIAGNOSIS — B20 Human immunodeficiency virus [HIV] disease: Secondary | ICD-10-CM

## 2014-01-18 MED ORDER — DARUNAVIR ETHANOLATE 800 MG PO TABS: 800.0000 mg | ORAL_TABLET | Freq: Every day | ORAL | Status: DC

## 2014-01-18 MED ORDER — LAMIVUDINE-ZIDOVUDINE 150-300 MG PO TABS: 1.0000 | ORAL_TABLET | Freq: Two times a day (BID) | ORAL | Status: DC

## 2014-01-18 MED ORDER — RITONAVIR 100 MG PO TABS: 100.0000 mg | ORAL_TABLET | Freq: Every day | ORAL | Status: DC

## 2014-01-18 MED ORDER — RALTEGRAVIR POTASSIUM 400 MG PO TABS: 400.0000 mg | ORAL_TABLET | Freq: Two times a day (BID) | ORAL | Status: DC

## 2014-01-18 NOTE — Telephone Encounter (Signed)
Per Dr. Ninetta LightsHatcher, patient's regimen is Combivir and Isentress twice daily with Prezista and Norvir once daily. Andree CossMichelle M Jolyssa Oplinger, RN

## 2014-01-20 ENCOUNTER — Other Ambulatory Visit: Payer: Self-pay | Admitting: Licensed Clinical Social Worker

## 2014-01-20 DIAGNOSIS — B20 Human immunodeficiency virus [HIV] disease: Secondary | ICD-10-CM

## 2014-01-20 MED ORDER — RALTEGRAVIR POTASSIUM 400 MG PO TABS: 400.0000 mg | ORAL_TABLET | Freq: Two times a day (BID) | ORAL | Status: DC

## 2014-05-30 ENCOUNTER — Other Ambulatory Visit: Payer: Medicaid Other

## 2014-06-12 ENCOUNTER — Telehealth: Payer: Self-pay | Admitting: *Deleted

## 2014-06-12 MED ORDER — ENSURE PO LIQD: 237.0000 mL | Freq: Two times a day (BID) | ORAL | Status: DC

## 2014-06-12 NOTE — Telephone Encounter (Signed)
BMI value appropriate for THP

## 2014-06-14 ENCOUNTER — Ambulatory Visit: Payer: Medicaid Other | Admitting: Infectious Diseases

## 2014-06-29 ENCOUNTER — Other Ambulatory Visit: Payer: Self-pay | Admitting: Infectious Diseases

## 2014-06-30 ENCOUNTER — Other Ambulatory Visit: Payer: Self-pay | Admitting: *Deleted

## 2014-06-30 DIAGNOSIS — B20 Human immunodeficiency virus [HIV] disease: Secondary | ICD-10-CM

## 2014-06-30 MED ORDER — LAMIVUDINE-ZIDOVUDINE 150-300 MG PO TABS: 1.0000 | ORAL_TABLET | Freq: Two times a day (BID) | ORAL | Status: DC

## 2014-06-30 MED ORDER — RALTEGRAVIR POTASSIUM 400 MG PO TABS: 400.0000 mg | ORAL_TABLET | Freq: Two times a day (BID) | ORAL | Status: DC

## 2014-06-30 MED ORDER — RITONAVIR 100 MG PO TABS: 100.0000 mg | ORAL_TABLET | Freq: Every day | ORAL | Status: DC

## 2014-06-30 MED ORDER — DARUNAVIR ETHANOLATE 800 MG PO TABS: 800.0000 mg | ORAL_TABLET | Freq: Every day | ORAL | Status: DC

## 2014-06-30 NOTE — Telephone Encounter (Signed)
Requested pt call [facility] to make lab and MD appts

## 2014-09-11 ENCOUNTER — Other Ambulatory Visit: Payer: Self-pay | Admitting: *Deleted

## 2014-09-11 DIAGNOSIS — B20 Human immunodeficiency virus [HIV] disease: Secondary | ICD-10-CM

## 2014-09-11 MED ORDER — RALTEGRAVIR POTASSIUM 400 MG PO TABS: 400.0000 mg | ORAL_TABLET | Freq: Two times a day (BID) | ORAL | Status: DC

## 2014-09-11 MED ORDER — LAMIVUDINE-ZIDOVUDINE 150-300 MG PO TABS: 1.0000 | ORAL_TABLET | Freq: Two times a day (BID) | ORAL | Status: DC

## 2014-09-11 MED ORDER — DARUNAVIR ETHANOLATE 800 MG PO TABS: 800.0000 mg | ORAL_TABLET | Freq: Every day | ORAL | Status: DC

## 2014-09-11 MED ORDER — RITONAVIR 100 MG PO TABS: 100.0000 mg | ORAL_TABLET | Freq: Every day | ORAL | Status: DC

## 2014-09-12 ENCOUNTER — Other Ambulatory Visit: Payer: Medicaid Other

## 2014-09-13 ENCOUNTER — Other Ambulatory Visit: Payer: Medicaid Other

## 2014-09-13 DIAGNOSIS — B20 Human immunodeficiency virus [HIV] disease: Secondary | ICD-10-CM

## 2014-09-13 LAB — COMPREHENSIVE METABOLIC PANEL
ALK PHOS: 87 U/L (ref 39–117)
ALT: 10 U/L (ref 0–53)
AST: 15 U/L (ref 0–37)
Albumin: 3.9 g/dL (ref 3.5–5.2)
BUN: 7 mg/dL (ref 6–23)
CO2: 22 meq/L (ref 19–32)
CREATININE: 0.56 mg/dL (ref 0.50–1.35)
Calcium: 9.1 mg/dL (ref 8.4–10.5)
Chloride: 109 mEq/L (ref 96–112)
Glucose, Bld: 80 mg/dL (ref 70–99)
Potassium: 3.9 mEq/L (ref 3.5–5.3)
Sodium: 138 mEq/L (ref 135–145)
Total Bilirubin: 0.8 mg/dL (ref 0.2–1.2)
Total Protein: 6.4 g/dL (ref 6.0–8.3)

## 2014-09-14 LAB — CBC
HCT: 32.2 % — ABNORMAL LOW (ref 39.0–52.0)
Hemoglobin: 10.8 g/dL — ABNORMAL LOW (ref 13.0–17.0)
MCH: 35.4 pg — ABNORMAL HIGH (ref 26.0–34.0)
MCHC: 33.5 g/dL (ref 30.0–36.0)
MCV: 105.6 fL — ABNORMAL HIGH (ref 78.0–100.0)
MPV: 10.1 fL (ref 8.6–12.4)
PLATELETS: 277 10*3/uL (ref 150–400)
RBC: 3.05 MIL/uL — AB (ref 4.22–5.81)
RDW: 17 % — AB (ref 11.5–15.5)
WBC: 6.7 10*3/uL (ref 4.0–10.5)

## 2014-09-14 LAB — HIV-1 RNA QUANT-NO REFLEX-BLD: HIV-1 RNA Quant, Log: <1.3 {Log} (ref <1.30)

## 2014-09-15 LAB — T-HELPER CELL (CD4) - (RCID CLINIC ONLY)
CD4 % Helper T Cell: 18 % — ABNORMAL LOW (ref 33–55)
CD4 T Cell Abs: 480 /uL (ref 400–2700)

## 2014-10-10 ENCOUNTER — Ambulatory Visit: Payer: Medicaid Other | Admitting: Infectious Diseases

## 2014-10-16 ENCOUNTER — Ambulatory Visit: Payer: Medicaid Other | Admitting: Infectious Diseases

## 2014-10-24 ENCOUNTER — Ambulatory Visit: Payer: Medicaid Other | Admitting: Infectious Diseases

## 2014-11-01 ENCOUNTER — Ambulatory Visit (INDEPENDENT_AMBULATORY_CARE_PROVIDER_SITE_OTHER): Payer: Medicaid Other | Admitting: Infectious Diseases

## 2014-11-01 ENCOUNTER — Encounter: Payer: Self-pay | Admitting: Infectious Diseases

## 2014-11-01 VITALS — BP 121/70 | HR 74 | Temp 97.5°F | Ht 73.0 in | Wt 132.0 lb

## 2014-11-01 DIAGNOSIS — Z79899 Other long term (current) drug therapy: Secondary | ICD-10-CM

## 2014-11-01 DIAGNOSIS — Z113 Encounter for screening for infections with a predominantly sexual mode of transmission: Secondary | ICD-10-CM | POA: Diagnosis not present

## 2014-11-01 DIAGNOSIS — B20 Human immunodeficiency virus [HIV] disease: Secondary | ICD-10-CM

## 2014-11-01 DIAGNOSIS — Z23 Encounter for immunization: Secondary | ICD-10-CM

## 2014-11-01 MED ORDER — DOLUTEGRAVIR SODIUM 50 MG PO TABS: 50.0000 mg | ORAL_TABLET | Freq: Every day | ORAL | Status: DC

## 2014-11-01 MED ORDER — DARUNAVIR-COBICISTAT 800-150 MG PO TABS: 1.0000 | ORAL_TABLET | Freq: Every day | ORAL | Status: DC

## 2014-11-01 MED ORDER — ENSURE PO LIQD: 237.0000 mL | Freq: Three times a day (TID) | ORAL | Status: DC

## 2014-11-01 NOTE — Progress Notes (Signed)
   Subjective:    Patient ID: Kent Barnes, male    DOB: November 06, 1955, 59 y.o.   MRN: [id number]  HPI 59 yo M with hx of HIV+ since 1993. First seen in ID clinic 2005 and was previously on ATVr/TRV through the [facility].  He was lost to f/u for several years and then [facility] to 10-11-09 with multiple emboli. 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 He then developed upper abd fluid collections (? abscesses). He was not a surgical candidate and he was started on coumadin. His course was also complicated by C diff. His CD4 in hospital was 30 and VL was 34,000 (geno K103N). repeated 09-19-09 VL 251, CD4 230. He was started on atripla.  Genotype 10-29-09 K103N,M184V,P225H  Re-adm to hospital 6-7 to 02-15-10 and found to have renal failure, acidosis, anemia (colon negative, EGD Abnormal area in the proximal stomach representing a small mass versus scar tissue as well as submucosal lesion approximately 1 cm diameter in esophagus, probable lipoma. Pathology results revealed chronic active gastritis with ulceration and intestinal metaplasia. No evidence of dysplasia or malignancy.).  His ART was stopped. Cr 1.37 (02-18-10). On return 03-18-10 and was started on CBV/ISN/DRVr. He had been staying at an assisted living facility, now has own apt in [place].  Last seen in ID 11-21-13 after being out of care for 2 yrs. He was restarted on DRVr/ISN for last 2 weeks (should also be on CBV).   Today wants simpler regimen.  Wants to gain wt (wt down 40# since 2013), "eats like a horse". Lives alone now, does all his own ADLs.   HIV 1 RNA QUANT (copies/mL)  Date Value  09/13/2014 <20  11/21/2013 81*  06/04/2012 <20   CD4 T CELL ABS  Date Value  09/13/2014 480 /uL  11/21/2013 420 /uL  06/04/2012 430 cmm    Lipids, rpr, wants wt  gain rx  Review of Systems  Constitutional: Negative for appetite change and unexpected weight change.  Gastrointestinal: Negative for diarrhea and constipation.  Genitourinary: Negative for difficulty urinating.       Objective:   Physical Exam  Constitutional: He appears well-developed and well-nourished.  HENT:  Mouth/Throat: No oropharyngeal exudate.  Eyes: EOM are normal. Pupils are equal, round, and reactive to light.  Neck: Neck supple.  Cardiovascular: Normal rate, regular rhythm and normal heart sounds.   Pulmonary/Chest: Effort normal and breath sounds normal.  Abdominal: Soft. Bowel sounds are normal. He exhibits no distension. There is no tenderness.  Musculoskeletal: He exhibits no edema.  Lymphadenopathy:    He has no cervical adenopathy.       Assessment & Plan:

## 2014-11-01 NOTE — Assessment & Plan Note (Signed)
Will get hep B #3 [facility] and change [facility] to [facility]V.  Hopefully can change his CBV to TAV/3TC this fall.  Will give him megace for wt gain, also refill his ensure, have him meet with [facility].  rtc 4 months, labs prior

## 2014-11-01 NOTE — Addendum Note (Signed)
Addended by: Andree CossHOWELL, Rui Wordell M on: 11/01/2014 11:36 AM   Modules accepted: Orders

## 2014-11-01 NOTE — Progress Notes (Signed)
HPI: Kent Barnes is a 59 y.o. male who presents to the [facility] today for follow-up on his HIV infection.  Allergies: Allergies  Allergen Reactions  . Codeine   . Sulfamethoxazole-Trimethoprim     REACTION: rash  . Sulfonamide Derivatives     REACTION: RASH    Vitals: Temp: 97.5 F (36.4 C) (03/02 0840) Temp Source: Oral (03/02 0840) BP: 121/70 mmHg (03/02 0840) Pulse Rate: 74 (03/02 0840)  Past Medical History: No past medical history on file.  Social History: History   Social History  . Marital Status: Divorced    Spouse Name: N/A  . Number of Children: N/A  . Years of Education: N/A   Social History Main Topics  . Smoking status: Current Every Day Smoker -- 0.30 packs/day for 38 years    Types: Cigarettes  . Smokeless tobacco: Never Used     Comment: trying to cut back  . Alcohol Use: 0.0 oz/week    0 Standard drinks or equivalent per week     Comment: occassionally  . Drug Use: No  . Sexual Activity: Not Currently     Comment: pt. declined condoms   Other Topics Concern  . None   Social History Narrative    Previous Regimen: Combivir + Isentress + Norvir + Prezista  Current Regimen: Combivir + Isentress + Norvir + Prezista  Labs: HIV 1 RNA QUANT (copies/mL)  Date Value  09/13/2014 <20  11/21/2013 81*  06/04/2012 <20   CD4 T CELL ABS  Date Value  09/13/2014 480 /uL  11/21/2013 420 /uL  06/04/2012 430 cmm   HEP B S AB (no units)  Date Value  11/21/2013 NEG   HEPATITIS B SURFACE AG (no units)  Date Value  12/17/2011 NEGATIVE   HCV AB (no units)  Date Value  12/17/2011 NEGATIVE    CrCl: CrCl cannot be calculated (Patient has no serum creatinine result on file.).  Lipids:    Component Value Date/Time   CHOL 96 11/21/2013 1056   TRIG 48 11/21/2013 1056   HDL 48 11/21/2013 1056   CHOLHDL 2.0 11/21/2013 1056   VLDL 10 11/21/2013 1056   LDLCALC 38 11/21/2013 1056    Assessment: 59 yo m who presents to the [facility] for  follow-up on his HIV infection.  Patient was switched to a new HIV regiment today of Prezcobix + Combivir + Tivicay. Counseled patient on how to take new medication regimen and provided patient with a calendar of his medication schedule.  Recommendations: Combivir 150-300 mg twice daily Prezcobix 800-150 mg once daily Tivicay 50 mg once daily   Mariabelen Pressly L. Roseanne RenoStewart, PharmD Clinical Infectious Disease Pharmacist [facility] 11/01/2014, 10:17 AM

## 2015-03-09 ENCOUNTER — Other Ambulatory Visit: Payer: Self-pay | Admitting: Infectious Diseases

## 2015-03-09 DIAGNOSIS — B20 Human immunodeficiency virus [HIV] disease: Secondary | ICD-10-CM

## 2015-04-11 ENCOUNTER — Other Ambulatory Visit: Payer: Self-pay | Admitting: *Deleted

## 2015-04-11 DIAGNOSIS — B20 Human immunodeficiency virus [HIV] disease: Secondary | ICD-10-CM

## 2015-04-11 MED ORDER — ENSURE PO LIQD: 237.0000 mL | Freq: Three times a day (TID) | ORAL | Status: DC

## 2015-05-22 ENCOUNTER — Other Ambulatory Visit: Payer: Medicaid Other

## 2015-05-24 ENCOUNTER — Other Ambulatory Visit: Payer: Medicaid Other

## 2015-05-30 ENCOUNTER — Other Ambulatory Visit: Payer: Medicaid Other

## 2015-05-30 ENCOUNTER — Other Ambulatory Visit (HOSPITAL_COMMUNITY)
Admission: RE | Admit: 2015-05-30 | Discharge: 2015-05-30 | Disposition: A | Discharge status: Home / Self Care | Payer: Medicaid Other | Source: Ambulatory Visit | Attending: Infectious Diseases | Admitting: Infectious Diseases

## 2015-05-30 DIAGNOSIS — Z113 Encounter for screening for infections with a predominantly sexual mode of transmission: Secondary | ICD-10-CM | POA: Insufficient documentation

## 2015-05-30 DIAGNOSIS — R5383 Other fatigue: Secondary | ICD-10-CM

## 2015-05-30 DIAGNOSIS — B20 Human immunodeficiency virus [HIV] disease: Secondary | ICD-10-CM

## 2015-05-30 DIAGNOSIS — Z79899 Other long term (current) drug therapy: Secondary | ICD-10-CM

## 2015-05-30 LAB — COMPLETE METABOLIC PANEL WITH GFR
ALT: 14 U/L (ref 9–46)
AST: 20 U/L (ref 10–35)
Albumin: 4.2 g/dL (ref 3.6–5.1)
Alkaline Phosphatase: 64 U/L (ref 40–115)
BILIRUBIN TOTAL: 0.6 mg/dL (ref 0.2–1.2)
BUN: 11 mg/dL (ref 7–25)
CALCIUM: 9.4 mg/dL (ref 8.6–10.3)
CHLORIDE: 105 mmol/L (ref 98–110)
CO2: 27 mmol/L (ref 20–31)
CREATININE: 0.72 mg/dL (ref 0.70–1.33)
GFR, Est Non African American: >89 mL/min (ref >=60)
Glucose, Bld: 83 mg/dL (ref 65–99)
Potassium: 5.1 mmol/L (ref 3.5–5.3)
Sodium: 139 mmol/L (ref 135–146)
TOTAL PROTEIN: 7.4 g/dL (ref 6.1–8.1)

## 2015-05-30 LAB — CBC
HEMATOCRIT: 34.6 % — AB (ref 39.0–52.0)
Hemoglobin: 11.6 g/dL — ABNORMAL LOW (ref 13.0–17.0)
MCH: 36 pg — ABNORMAL HIGH (ref 26.0–34.0)
MCHC: 33.5 g/dL (ref 30.0–36.0)
MCV: 107.5 fL — AB (ref 78.0–100.0)
MPV: 9.5 fL (ref 8.6–12.4)
PLATELETS: 368 10*3/uL (ref 150–400)
RBC: 3.22 MIL/uL — ABNORMAL LOW (ref 4.22–5.81)
RDW: 14.8 % (ref 11.5–15.5)
WBC: 7.2 10*3/uL (ref 4.0–10.5)

## 2015-05-30 LAB — TSH: TSH: 1.117 u[IU]/mL (ref 0.350–4.500)

## 2015-05-30 LAB — LIPID PANEL
CHOLESTEROL: 118 mg/dL — AB (ref 125–200)
HDL: 61 mg/dL (ref >=40)
LDL CALC: 48 mg/dL (ref <130)
TRIGLYCERIDES: 47 mg/dL (ref <150)
Total CHOL/HDL Ratio: 1.9 Ratio (ref <=5.0)
VLDL: 9 mg/dL (ref <30)

## 2015-05-30 LAB — HEMOGLOBIN A1C
HEMOGLOBIN A1C: 5 % (ref <5.7)
Mean Plasma Glucose: 97 mg/dL (ref <117)

## 2015-05-30 LAB — VITAMIN B12: VITAMIN B 12: 319 pg/mL (ref 211–911)

## 2015-05-31 LAB — T-HELPER CELL (CD4) - (RCID CLINIC ONLY)
CD4 % Helper T Cell: 19 % — ABNORMAL LOW (ref 33–55)
CD4 T Cell Abs: 440 /uL (ref 400–2700)

## 2015-05-31 LAB — PSA: PSA: 0.28 ng/mL (ref <=4.00)

## 2015-05-31 LAB — HIV-1 RNA QUANT-NO REFLEX-BLD: HIV 1 RNA Quant: <20 copies/mL (ref <20)

## 2015-05-31 LAB — VITAMIN D 25 HYDROXY (VIT D DEFICIENCY, FRACTURES): VIT D 25 HYDROXY: 23 ng/mL — AB (ref 30–100)

## 2015-05-31 LAB — RPR

## 2015-06-01 LAB — URINE CYTOLOGY ANCILLARY ONLY
Chlamydia: NEGATIVE
Neisseria Gonorrhea: NEGATIVE

## 2015-06-05 ENCOUNTER — Ambulatory Visit: Payer: Medicaid Other | Admitting: Infectious Diseases

## 2015-06-19 ENCOUNTER — Encounter: Payer: Self-pay | Admitting: Infectious Diseases

## 2015-06-19 ENCOUNTER — Ambulatory Visit (INDEPENDENT_AMBULATORY_CARE_PROVIDER_SITE_OTHER): Payer: Medicaid Other | Admitting: Infectious Diseases

## 2015-06-19 VITALS — BP 127/68 | HR 73 | Temp 98.2°F | Ht 73.0 in | Wt 132.9 lb

## 2015-06-19 DIAGNOSIS — K219 Gastro-esophageal reflux disease without esophagitis: Secondary | ICD-10-CM | POA: Diagnosis not present

## 2015-06-19 DIAGNOSIS — F17200 Nicotine dependence, unspecified, uncomplicated: Secondary | ICD-10-CM

## 2015-06-19 DIAGNOSIS — B20 Human immunodeficiency virus [HIV] disease: Secondary | ICD-10-CM

## 2015-06-19 DIAGNOSIS — I10 Essential (primary) hypertension: Secondary | ICD-10-CM | POA: Diagnosis not present

## 2015-06-19 DIAGNOSIS — Z79899 Other long term (current) drug therapy: Secondary | ICD-10-CM

## 2015-06-19 DIAGNOSIS — Z113 Encounter for screening for infections with a predominantly sexual mode of transmission: Secondary | ICD-10-CM | POA: Diagnosis not present

## 2015-06-19 DIAGNOSIS — Z23 Encounter for immunization: Secondary | ICD-10-CM

## 2015-06-19 MED ORDER — DARUNAVIR ETHANOLATE 800 MG PO TABS: 800.0000 mg | ORAL_TABLET | Freq: Every day | ORAL | Status: DC

## 2015-06-19 MED ORDER — ELVITEG-COBIC-EMTRICIT-TENOFAF 150-150-200-10 MG PO TABS: 1.0000 | ORAL_TABLET | Freq: Every day | ORAL | Status: DC

## 2015-06-19 NOTE — Assessment & Plan Note (Addendum)
Will change his meds to genvoya/DRV to simplify his art.  His prev sulfa reaction was itching. He has tolerated DRV without cross reaction.  He has gotten flu shot.  Has completed Hep B x 2.  Will see back in 4 months to re-assess med tolerance.

## 2015-06-19 NOTE — Assessment & Plan Note (Signed)
Well controlled 

## 2015-06-19 NOTE — Assessment & Plan Note (Signed)
Improved with zantac Will watch

## 2015-06-19 NOTE — Progress Notes (Signed)
   Subjective:    Patient ID: Kent Barnes, male    DOB: November 09, 1955, 59 y.o.   MRN: [id number]  HPI 59 yo M with hx of HIV+ since 1993. First seen in ID clinic 2005 and was previously on [facility]TRV through the [facility].  He was lost to f/u for several years and then adm [facility] to 10-11-09 with multiple emboli. 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 He then developed upper abd fluid collections (? abscesses). He was not a surgical candidate and he was started on coumadin. His course was also complicated by C diff. His CD4 in hospital was 30 and VL was 34,000 (geno K103N). repeated 09-19-09 VL 251, CD4 230. He was started on atripla.  Genotype 10-29-09 K103N,M184V,P225H  Re-adm to hospital 6-7 to 02-15-10 and found to have renal failure, acidosis, anemia (colon negative, EGD Abnormal area in the proximal stomach representing a small mass versus scar tissue as well as submucosal lesion approximately 1 cm diameter in esophagus, probable lipoma. Pathology results revealed chronic active gastritis with ulceration and intestinal metaplasia. No evidence of dysplasia or malignancy.).  His ART was stopped. Cr 1.37 (02-18-10). On return 03-18-10 and was started on CBV/ISN/DRVr.  Last seen in ID 11-21-13 after being out of care for 2 yrs. He was restarted on DRVr/ISN for last 2 weeks (should also be on CBV).   Was seen in ID March 2016 and changed to CBV/DRVc/DTGV.  His aunt died this past weekend.  Has crook in his neck after sleeping on it wrong  Living in own apt, does all his own ADL.  Has been eating constantly. Wt has been steady. Has been taking ensure, TID.  Recently started on zantac for AM nausea. Resolved.   Still smoking cigarettes.   HIV 1 RNA QUANT (copies/mL)  Date Value  05/30/2015 <20  09/13/2014 <20    11/21/2013 81*   CD4 T CELL ABS (/uL)  Date Value  05/30/2015 440  09/13/2014 480  11/21/2013 420    Strength has been fine. I can walk with walker but uses wheel chair.   Review of Systems  Constitutional: Negative for appetite change and unexpected weight change.  Respiratory: Negative for cough and shortness of breath.   Gastrointestinal: Negative for diarrhea and constipation.  Genitourinary: Negative for difficulty urinating.  Musculoskeletal: Negative for myalgias and arthralgias.       Objective:   Physical Exam  Constitutional: He appears well-developed and well-nourished.  HENT:  Mouth/Throat: No oropharyngeal exudate.  Eyes: EOM are normal. Pupils are equal, round, and reactive to light.  Neck: Neck supple.  Cardiovascular: Normal rate, regular rhythm and normal heart sounds.   Pulmonary/Chest: Effort normal and breath sounds normal.  Abdominal: Soft. Bowel sounds are normal. He exhibits no distension. There is no tenderness. There is no rebound.  Musculoskeletal: He exhibits no edema.  Lymphadenopathy:    He has no cervical adenopathy.  Psychiatric: He has a normal mood and affect.       Assessment & Plan:

## 2015-06-19 NOTE — Assessment & Plan Note (Signed)
Encouraged to quit. 

## 2015-06-20 ENCOUNTER — Telehealth: Payer: Self-pay | Admitting: *Deleted

## 2015-06-20 NOTE — Telephone Encounter (Signed)
Patietn called to confirm his new regimen.  He will stop the prescobix, tivicay and combivir.  He will start the prezista and genvoya.   He spelled each medication to make sure he was taking the correct regimen. Andree CossHowell, Myrtie Leuthold M, RN

## 2015-06-21 NOTE — Telephone Encounter (Signed)
Confirmed regimen with pharmacy

## 2015-10-22 ENCOUNTER — Ambulatory Visit: Payer: Medicaid Other | Admitting: Infectious Diseases

## 2016-03-24 ENCOUNTER — Other Ambulatory Visit: Payer: Medicaid Other

## 2016-03-24 DIAGNOSIS — Z79899 Other long term (current) drug therapy: Secondary | ICD-10-CM

## 2016-03-24 DIAGNOSIS — B20 Human immunodeficiency virus [HIV] disease: Secondary | ICD-10-CM

## 2016-03-24 DIAGNOSIS — Z113 Encounter for screening for infections with a predominantly sexual mode of transmission: Secondary | ICD-10-CM

## 2016-03-24 LAB — COMPLETE METABOLIC PANEL WITH GFR
ALBUMIN: 3.2 g/dL — AB (ref 3.6–5.1)
ALK PHOS: 74 U/L (ref 40–115)
ALT: 12 U/L (ref 9–46)
AST: 18 U/L (ref 10–35)
BUN: 7 mg/dL (ref 7–25)
CALCIUM: 8.2 mg/dL — AB (ref 8.6–10.3)
CO2: 23 mmol/L (ref 20–31)
CREATININE: 0.6 mg/dL — AB (ref 0.70–1.33)
Chloride: 109 mmol/L (ref 98–110)
GFR, Est Non African American: >89 mL/min (ref >=60)
Glucose, Bld: 81 mg/dL (ref 65–99)
Potassium: 4.3 mmol/L (ref 3.5–5.3)
Sodium: 137 mmol/L (ref 135–146)
Total Bilirubin: 0.6 mg/dL (ref 0.2–1.2)
Total Protein: 6 g/dL — ABNORMAL LOW (ref 6.1–8.1)

## 2016-03-24 LAB — LIPID PANEL
CHOLESTEROL: 97 mg/dL — AB (ref 125–200)
HDL: 56 mg/dL (ref >=40)
LDL Cholesterol: 31 mg/dL (ref <130)
Total CHOL/HDL Ratio: 1.7 Ratio (ref <=5.0)
Triglycerides: 48 mg/dL (ref <150)
VLDL: 10 mg/dL (ref <30)

## 2016-03-24 LAB — CBC
HEMATOCRIT: 27.9 % — AB (ref 38.5–50.0)
HEMOGLOBIN: 9 g/dL — AB (ref 13.2–17.1)
MCH: 29.8 pg (ref 27.0–33.0)
MCHC: 32.3 g/dL (ref 32.0–36.0)
MCV: 92.4 fL (ref 80.0–100.0)
MPV: 9.2 fL (ref 7.5–12.5)
Platelets: 342 10*3/uL (ref 140–400)
RBC: 3.02 MIL/uL — ABNORMAL LOW (ref 4.20–5.80)
RDW: 17.1 % — ABNORMAL HIGH (ref 11.0–15.0)
WBC: 7.9 10*3/uL (ref 3.8–10.8)

## 2016-03-25 LAB — T-HELPER CELL (CD4) - (RCID CLINIC ONLY)
CD4 % Helper T Cell: 21 % — ABNORMAL LOW (ref 33–55)
CD4 T Cell Abs: 560 /uL (ref 400–2700)

## 2016-03-25 LAB — RPR

## 2016-03-26 ENCOUNTER — Other Ambulatory Visit: Payer: Self-pay | Admitting: Infectious Diseases

## 2016-03-26 ENCOUNTER — Telehealth: Payer: Self-pay | Admitting: *Deleted

## 2016-03-26 ENCOUNTER — Other Ambulatory Visit: Payer: Medicaid Other

## 2016-03-26 DIAGNOSIS — R899 Unspecified abnormal finding in specimens from other organs, systems and tissues: Secondary | ICD-10-CM

## 2016-03-26 LAB — CBC
HEMATOCRIT: 28.1 % — AB (ref 38.5–50.0)
Hemoglobin: 9.1 g/dL — ABNORMAL LOW (ref 13.2–17.1)
MCH: 29.4 pg (ref 27.0–33.0)
MCHC: 32.4 g/dL (ref 32.0–36.0)
MCV: 90.6 fL (ref 80.0–100.0)
MPV: 9.6 fL (ref 7.5–12.5)
PLATELETS: 348 10*3/uL (ref 140–400)
RBC: 3.1 MIL/uL — ABNORMAL LOW (ref 4.20–5.80)
RDW: 16.9 % — AB (ref 11.0–15.0)
WBC: 6.4 10*3/uL (ref 3.8–10.8)

## 2016-03-26 NOTE — Telephone Encounter (Signed)
-----   Message from Ginnie Smart, MD sent at 03/24/2016  5:27 PM EDT ----- Pt needs repeat CBC

## 2016-03-26 NOTE — Telephone Encounter (Signed)
Patient notified and added to lab schedule today. Kent Barnes

## 2016-03-27 LAB — HIV-1 RNA QUANT-NO REFLEX-BLD
HIV 1 RNA Quant: <20 copies/mL (ref <20)
HIV-1 RNA Quant, Log: <1.3 Log copies/mL (ref <1.30)

## 2016-03-31 ENCOUNTER — Telehealth: Payer: Self-pay | Admitting: *Deleted

## 2016-03-31 NOTE — Telephone Encounter (Signed)
Patient called, stating he received a call from the on-call MD over the weekend regarding lab results.  Patient's repeat hemoglobin/hematocrit was 9.1/28.1 on 7/26.  Pt denies signs/symptoms, in fact states he is feeling better than ever, has a good appetite and feels great. Patient does report bright red blood per rectum, but states this is within normal for him.   Per Dr. Drue Second, will 'cc his PCP with the lab results. Confirmed fax number, sent copies of labs to Dr. Teola Bradley for follow up. Patient offered to come in this afternoon for a quick lab visit (he has an eye appointment at 4), but this is not necessary per Dr Drue Second.  Patient educated when to seek care. He will follow up as scheduled with Dr. Ninetta Lights on 8/7. Andree Coss, RN

## 2016-04-01 ENCOUNTER — Telehealth: Payer: Self-pay

## 2016-04-01 NOTE — Telephone Encounter (Signed)
Received phone call from PCP Marletta Lor, NP office asking for more recent labs on patient. Called patient and left message to call to office. Patient called LPN back giving permission to have lab work sent to PCP Marletta Lor, NP. Patient stated that when he has blood work done in clinic, his PCP uses the lab values from the clinic to keep him from having to get stuck again and to help to keep from having another  bill. Patient stated he does this all the time. LPN asked patient upon next visit with clinic could he sign another release of information putting his PCP. Patient stated that will be fine. Patient thanked LPN for calling. Lab results from 07/24 faxed to PCP @ [phone]. OK status returned. Candace Murray,LPN

## 2016-04-07 ENCOUNTER — Other Ambulatory Visit: Payer: Self-pay | Admitting: *Deleted

## 2016-04-07 ENCOUNTER — Ambulatory Visit (INDEPENDENT_AMBULATORY_CARE_PROVIDER_SITE_OTHER): Payer: Medicaid Other | Admitting: Infectious Diseases

## 2016-04-07 ENCOUNTER — Encounter: Payer: Self-pay | Admitting: Internal Medicine

## 2016-04-07 ENCOUNTER — Encounter: Payer: Self-pay | Admitting: Infectious Diseases

## 2016-04-07 VITALS — BP 119/65 | HR 75 | Temp 98.3°F | Ht 73.0 in | Wt 125.5 lb

## 2016-04-07 DIAGNOSIS — F17200 Nicotine dependence, unspecified, uncomplicated: Secondary | ICD-10-CM

## 2016-04-07 DIAGNOSIS — J302 Other seasonal allergic rhinitis: Secondary | ICD-10-CM

## 2016-04-07 DIAGNOSIS — K2990 Gastroduodenitis, unspecified, without bleeding: Secondary | ICD-10-CM | POA: Diagnosis not present

## 2016-04-07 DIAGNOSIS — K2970 Gastritis, unspecified, without bleeding: Secondary | ICD-10-CM

## 2016-04-07 DIAGNOSIS — B20 Human immunodeficiency virus [HIV] disease: Secondary | ICD-10-CM

## 2016-04-07 MED ORDER — BECLOMETHASONE DIPROP MONOHYD 42 MCG/SPRAY NA SUSP: 1.0000 | Freq: Two times a day (BID) | NASAL | 5 refills | Status: DC

## 2016-04-07 MED ORDER — BECLOMETHASONE DIPROP MONOHYD 42 MCG/SPRAY NA SUSP: 1.0000 | Freq: Every day | NASAL | Status: DC

## 2016-04-07 MED ORDER — ENSURE PO LIQD: 237.0000 mL | Freq: Three times a day (TID) | ORAL | 5 refills | Status: DC

## 2016-04-07 NOTE — Assessment & Plan Note (Signed)
He is doing very well.  Will continue his current rx's Will change his nasal steroid.  See him back in 6 months.

## 2016-04-07 NOTE — Progress Notes (Signed)
   Subjective:    Patient ID: Kent Barnes, male    DOB: 02/12/1956, 60 y.o.   MRN: [id number]  HPI 60 yo M with hx of HIV+ since 1993. First seen in ID clinic 2005 and was previously on [facility]TRV through the [facility].  He was lost to f/u for several years and then [facility] to 10-11-09 with multiple emboli. 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 He then developed upper abd fluid collections (? abscesses). He was not a surgical candidate and he was started on coumadin. His course was also complicated by C diff. His CD4 in hospital was 30 and VL was 34,000 (geno K103N). repeated 09-19-09 VL 251, CD4 230. He was started on atripla.  Genotype 10-29-09 K103N,M184V,P225H  Re-adm to hospital 6-7 to 02-15-10 and found to have renal failure, acidosis, anemia (colon negative, EGD Abnormal area in the proximal stomach representing a small mass versus scar tissue as well as submucosal lesion approximately 1 cm diameter in esophagus, probable lipoma. Pathology results revealed chronic active gastritis with ulceration and intestinal metaplasia. No evidence of dysplasia or malignancy.).  His ART was stopped. Cr 1.37 (02-18-10). On return 03-18-10 and was started on CBV/ISN/DRVr.  Last seen in ID 11-21-13 after being out of care for 2 yrs. He was restarted on DRVr/ISN for last 2 weeks (should also be on CBV).   Was seen in ID March 2016 and changed to CBV/DRVc/DTGV.  He was seen in ID 06-2015 and changed to genvoya/Darunavir. He has had no problems with this.   Has 2g hgb drop since 05-2016. Normal BM, on blood in BM, no melena. Has occas episodes of dizziness with bending over, standing up quickly.  He otherwise feels well. Has not been gaining wt despite eating a lot.  Has been on flonase for ~ 1 year. Hasn't been helping.    Still smoking, but quit drinking.   HIV 1 RNA Quant (copies/mL)  Date Value  03/24/2016 <20  05/30/2015 <20  09/13/2014 <20   CD4 T Cell Abs (/uL)  Date Value  03/24/2016 560  05/30/2015 440  09/13/2014 480    Review of Systems  Constitutional: Negative for appetite change and unexpected weight change.  Gastrointestinal: Negative for anal bleeding, blood in stool, constipation and diarrhea.  Genitourinary: Negative for difficulty urinating.  Neurological: Positive for light-headedness.       Objective:   Physical Exam  Constitutional: He appears well-developed and well-nourished.  HENT:  Mouth/Throat: No oropharyngeal exudate.  Eyes: EOM are normal. Pupils are equal, round, and reactive to light.  Neck: Neck supple.  Cardiovascular: Normal rate, regular rhythm and normal heart sounds.   Pulmonary/Chest: Effort normal and breath sounds normal.  Abdominal: Soft. Bowel sounds are normal. There is no tenderness. There is no rebound.  Musculoskeletal: He exhibits no edema.  Lymphadenopathy:    He has no cervical adenopathy.       Assessment & Plan:

## 2016-04-07 NOTE — Telephone Encounter (Signed)
Pt being sent to GI Thank you for your dedication to the care of our patients, to our community and your commitment to excellence.

## 2016-04-07 NOTE — Addendum Note (Signed)
Addended by: Jennet MaduroESTRIDGE, Melonee Gerstel D on: 04/07/2016 03:34 PM   Modules accepted: Orders

## 2016-04-07 NOTE — Assessment & Plan Note (Signed)
Will get him back into GI due to drop in Hgb

## 2016-04-07 NOTE — Assessment & Plan Note (Signed)
Encouraged to quit. 

## 2016-05-27 ENCOUNTER — Other Ambulatory Visit: Payer: Self-pay | Admitting: Infectious Diseases

## 2016-05-27 DIAGNOSIS — B20 Human immunodeficiency virus [HIV] disease: Secondary | ICD-10-CM

## 2016-06-11 ENCOUNTER — Ambulatory Visit: Payer: Medicaid Other | Admitting: Internal Medicine

## 2016-06-16 ENCOUNTER — Telehealth: Payer: Self-pay | Admitting: *Deleted

## 2016-06-16 NOTE — Telephone Encounter (Signed)
Patient NO SHOWED for referral to [facility].

## 2016-07-23 ENCOUNTER — Encounter (HOSPITAL_COMMUNITY): Payer: Self-pay

## 2016-07-23 ENCOUNTER — Observation Stay (HOSPITAL_COMMUNITY)
Admission: EM | Admit: 2016-07-23 | Discharge: 2016-07-24 | Disposition: A | Discharge status: Home / Self Care | Payer: Medicaid Other | Attending: Internal Medicine | Admitting: Internal Medicine

## 2016-07-23 ENCOUNTER — Emergency Department (HOSPITAL_COMMUNITY): Payer: Medicaid Other

## 2016-07-23 DIAGNOSIS — R0602 Shortness of breath: Secondary | ICD-10-CM | POA: Diagnosis not present

## 2016-07-23 DIAGNOSIS — B20 Human immunodeficiency virus [HIV] disease: Secondary | ICD-10-CM | POA: Diagnosis not present

## 2016-07-23 DIAGNOSIS — Z79899 Other long term (current) drug therapy: Secondary | ICD-10-CM | POA: Insufficient documentation

## 2016-07-23 DIAGNOSIS — R9431 Abnormal electrocardiogram [ECG] [EKG]: Secondary | ICD-10-CM | POA: Diagnosis present

## 2016-07-23 DIAGNOSIS — G8220 Paraplegia, unspecified: Secondary | ICD-10-CM | POA: Diagnosis present

## 2016-07-23 DIAGNOSIS — F17210 Nicotine dependence, cigarettes, uncomplicated: Secondary | ICD-10-CM | POA: Insufficient documentation

## 2016-07-23 DIAGNOSIS — R0789 Other chest pain: Secondary | ICD-10-CM

## 2016-07-23 DIAGNOSIS — K219 Gastro-esophageal reflux disease without esophagitis: Secondary | ICD-10-CM | POA: Diagnosis present

## 2016-07-23 DIAGNOSIS — I10 Essential (primary) hypertension: Secondary | ICD-10-CM

## 2016-07-23 DIAGNOSIS — R7989 Other specified abnormal findings of blood chemistry: Secondary | ICD-10-CM | POA: Diagnosis not present

## 2016-07-23 DIAGNOSIS — R748 Abnormal levels of other serum enzymes: Secondary | ICD-10-CM | POA: Diagnosis not present

## 2016-07-23 DIAGNOSIS — F17200 Nicotine dependence, unspecified, uncomplicated: Secondary | ICD-10-CM | POA: Diagnosis not present

## 2016-07-23 DIAGNOSIS — Z8673 Personal history of transient ischemic attack (TIA), and cerebral infarction without residual deficits: Secondary | ICD-10-CM | POA: Diagnosis not present

## 2016-07-23 DIAGNOSIS — R778 Other specified abnormalities of plasma proteins: Secondary | ICD-10-CM

## 2016-07-23 HISTORY — DX: Human immunodeficiency virus (HIV) disease: B20

## 2016-07-23 HISTORY — DX: Gastro-esophageal reflux disease without esophagitis: K21.9

## 2016-07-23 HISTORY — DX: Low back pain: M54.5

## 2016-07-23 HISTORY — DX: Depression, unspecified: F32.A

## 2016-07-23 HISTORY — DX: Anemia, unspecified: D64.9

## 2016-07-23 HISTORY — DX: Major depressive disorder, single episode, unspecified: F32.9

## 2016-07-23 HISTORY — DX: Personal history of other medical treatment: Z92.89

## 2016-07-23 HISTORY — DX: Paraplegia, unspecified: G82.20

## 2016-07-23 HISTORY — DX: Essential (primary) hypertension: I10

## 2016-07-23 HISTORY — DX: Cerebral infarction, unspecified: I63.9

## 2016-07-23 HISTORY — DX: Other chronic pain: G89.29

## 2016-07-23 LAB — I-STAT TROPONIN, ED: Troponin i, poc: 0.26 ng/mL (ref 0.00–0.08)

## 2016-07-23 LAB — CBC WITH DIFFERENTIAL/PLATELET
Basophils Absolute: 0 10*3/uL (ref 0.0–0.1)
Basophils Relative: 1 %
EOS PCT: 1 %
Eosinophils Absolute: 0.1 10*3/uL (ref 0.0–0.7)
HCT: 26 % — ABNORMAL LOW (ref 39.0–52.0)
Hemoglobin: 8.9 g/dL — ABNORMAL LOW (ref 13.0–17.0)
LYMPHS ABS: 2.2 10*3/uL (ref 0.7–4.0)
LYMPHS PCT: 27 %
MCH: 28.2 pg (ref 26.0–34.0)
MCHC: 34.2 g/dL (ref 30.0–36.0)
MCV: 82.3 fL (ref 78.0–100.0)
Monocytes Absolute: 0.8 10*3/uL (ref 0.1–1.0)
Monocytes Relative: 10 %
Neutro Abs: 5 10*3/uL (ref 1.7–7.7)
Neutrophils Relative %: 61 %
PLATELETS: 202 10*3/uL (ref 150–400)
RBC: 3.16 MIL/uL — AB (ref 4.22–5.81)
RDW: 16.8 % — ABNORMAL HIGH (ref 11.5–15.5)
WBC: 8.2 10*3/uL (ref 4.0–10.5)

## 2016-07-23 LAB — I-STAT CHEM 8, ED
BUN: 8 mg/dL (ref 6–20)
CHLORIDE: 107 mmol/L (ref 101–111)
CREATININE: 0.8 mg/dL (ref 0.61–1.24)
Calcium, Ion: 1.21 mmol/L (ref 1.15–1.40)
GLUCOSE: 90 mg/dL (ref 65–99)
HEMATOCRIT: 29 % — AB (ref 39.0–52.0)
Hemoglobin: 9.9 g/dL — ABNORMAL LOW (ref 13.0–17.0)
POTASSIUM: 3.6 mmol/L (ref 3.5–5.1)
Sodium: 141 mmol/L (ref 135–145)
TCO2: 24 mmol/L (ref 0–100)

## 2016-07-23 LAB — TROPONIN I
TROPONIN I: 0.27 ng/mL — AB (ref <0.03)
TROPONIN I: 0.32 ng/mL — AB (ref <0.03)
Troponin I: 0.31 ng/mL (ref <0.03)

## 2016-07-23 MED ORDER — BECLOMETHASONE DIPROP MONOHYD 42 MCG/SPRAY NA SUSP: 1.0000 | Freq: Two times a day (BID) | NASAL | Status: DC

## 2016-07-23 MED ORDER — SODIUM CHLORIDE 0.9 % IV SOLN
INTRAVENOUS | Status: DC
  Administered 2016-07-23: 10 mL/h via INTRAVENOUS

## 2016-07-23 MED ORDER — SODIUM CHLORIDE 0.9 % IV SOLN: INTRAVENOUS | Status: DC

## 2016-07-23 MED ORDER — ALBUTEROL SULFATE (2.5 MG/3ML) 0.083% IN NEBU: 2.5000 mg | INHALATION_SOLUTION | Freq: Four times a day (QID) | RESPIRATORY_TRACT | Status: DC | PRN

## 2016-07-23 MED ORDER — PRAVASTATIN SODIUM 40 MG PO TABS: 40.0000 mg | ORAL_TABLET | Freq: Every day | ORAL | Status: DC

## 2016-07-23 MED ORDER — DARIFENACIN HYDROBROMIDE ER 7.5 MG PO TB24
7.5000 mg | ORAL_TABLET | Freq: Every day | ORAL | Status: DC
  Administered 2016-07-24: 7.5 mg via ORAL
  Filled 2016-07-23: qty 1

## 2016-07-23 MED ORDER — FAMOTIDINE 20 MG PO TABS
20.0000 mg | ORAL_TABLET | Freq: Every day | ORAL | Status: DC
  Administered 2016-07-24: 20 mg via ORAL
  Filled 2016-07-23: qty 1

## 2016-07-23 MED ORDER — FAMOTIDINE 20 MG PO TABS: 10.0000 mg | ORAL_TABLET | Freq: Every day | ORAL | Status: DC

## 2016-07-23 MED ORDER — ONDANSETRON HCL 4 MG/2ML IJ SOLN: 4.0000 mg | Freq: Four times a day (QID) | INTRAMUSCULAR | Status: DC | PRN

## 2016-07-23 MED ORDER — LISINOPRIL 5 MG PO TABS: 5.0000 mg | ORAL_TABLET | Freq: Every day | ORAL | Status: DC

## 2016-07-23 MED ORDER — ACETAMINOPHEN 325 MG PO TABS: 650.0000 mg | ORAL_TABLET | ORAL | Status: DC | PRN

## 2016-07-23 MED ORDER — FLUTICASONE PROPIONATE 50 MCG/ACT NA SUSP
2.0000 | Freq: Every day | NASAL | Status: DC
  Filled 2016-07-23: qty 16

## 2016-07-23 MED ORDER — ALPRAZOLAM 0.5 MG PO TABS: 0.5000 mg | ORAL_TABLET | Freq: Every evening | ORAL | Status: DC | PRN

## 2016-07-23 MED ORDER — DARUNAVIR ETHANOLATE 800 MG PO TABS
800.0000 mg | ORAL_TABLET | Freq: Every day | ORAL | Status: DC
  Administered 2016-07-24: 800 mg via ORAL
  Filled 2016-07-23: qty 1

## 2016-07-23 MED ORDER — SERTRALINE HCL 50 MG PO TABS
50.0000 mg | ORAL_TABLET | Freq: Every day | ORAL | Status: DC
  Administered 2016-07-24: 50 mg via ORAL
  Filled 2016-07-23: qty 1

## 2016-07-23 MED ORDER — ALBUTEROL SULFATE HFA 108 (90 BASE) MCG/ACT IN AERS: 2.0000 | INHALATION_SPRAY | Freq: Four times a day (QID) | RESPIRATORY_TRACT | Status: DC | PRN

## 2016-07-23 MED ORDER — DARUNAVIR ETHANOLATE 600 MG PO TABS: 600.0000 mg | ORAL_TABLET | Freq: Every day | ORAL | Status: DC

## 2016-07-23 MED ORDER — TAMSULOSIN HCL 0.4 MG PO CAPS: 0.4000 mg | ORAL_CAPSULE | Freq: Every day | ORAL | Status: DC

## 2016-07-23 MED ORDER — TAMSULOSIN HCL 0.4 MG PO CAPS
0.4000 mg | ORAL_CAPSULE | Freq: Every day | ORAL | Status: DC
  Administered 2016-07-24: 0.4 mg via ORAL
  Filled 2016-07-23: qty 1

## 2016-07-23 MED ORDER — FINASTERIDE 5 MG PO TABS
5.0000 mg | ORAL_TABLET | Freq: Every day | ORAL | Status: DC
  Administered 2016-07-24: 5 mg via ORAL
  Filled 2016-07-23: qty 1

## 2016-07-23 MED ORDER — ELVITEG-COBIC-EMTRICIT-TENOFAF 150-150-200-10 MG PO TABS
1.0000 | ORAL_TABLET | Freq: Every day | ORAL | Status: DC
  Administered 2016-07-24: 1 via ORAL
  Filled 2016-07-23: qty 1

## 2016-07-23 MED ORDER — SERTRALINE HCL 50 MG PO TABS: 50.0000 mg | ORAL_TABLET | Freq: Every day | ORAL | Status: DC

## 2016-07-23 MED ORDER — LISINOPRIL 5 MG PO TABS
5.0000 mg | ORAL_TABLET | Freq: Every day | ORAL | Status: DC
  Administered 2016-07-24: 5 mg via ORAL
  Filled 2016-07-23: qty 1

## 2016-07-23 MED ORDER — ENOXAPARIN SODIUM 40 MG/0.4ML ~~LOC~~ SOLN
40.0000 mg | SUBCUTANEOUS | Status: DC
  Administered 2016-07-23: 40 mg via SUBCUTANEOUS
  Filled 2016-07-23: qty 0.4

## 2016-07-23 MED ORDER — GI COCKTAIL ~~LOC~~: 30.0000 mL | Freq: Four times a day (QID) | ORAL | Status: DC | PRN

## 2016-07-23 NOTE — ED Notes (Signed)
Pt used the bathroom (wheelchair). BM and urine reported

## 2016-07-23 NOTE — Progress Notes (Signed)
Pt admitted from ED, oriented to unit and routines, VSS, family notified by pt with room# and reason for admission

## 2016-07-23 NOTE — ED Notes (Signed)
MD at the bedside  

## 2016-07-23 NOTE — ED Notes (Signed)
Transported to xray 

## 2016-07-23 NOTE — ED Triage Notes (Signed)
Per EMS: Primary MD Kent Barnes checked his VS and "found something else", and ordered EKG at home. EKG was done, and after she reviewed, [facility]  Pt arrived in ED, alert, oriented x4, denies pain or sob.

## 2016-07-23 NOTE — H&P (Signed)
History and Physical    Kent Barnes ZOX:[id number] DOB: [birth year] DOA: 07/23/2016   PCP: Marletta Lor, NP   Patient coming from:  Home    Chief Complaint: Elevated troponins   HPI: Kent Barnes is a 60 y.o. 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 The patient denies any shortness of breath, chest pain at this time, or having increased generalized weakness. Of note, he did have intermittent chest discomfort about 2 months ago, which has come and go in many occasions, last episode 2 weeks ago. The patient denies any prior history of MI. He denies any nausea vomiting or diaphoresis. He denies any Joe pain order left upper extremity pain. He denies any lower extremity swelling. The patient has been in his baseline health otherwise. No bleeding issues. He denies any headaches or vision changes. He denies any confusion, seizures, or syncope or presyncope.    ED Course:  BP 113/64 (BP Location: Right Arm)   Pulse 73   Resp 14   Ht 6\' 1"  (1.854 m)   Wt 63.5 kg (140 lb)   SpO2 98%   BMI 18.47 kg/m    Tn 0.26  EKG without evidence of ACS. Tn 0.26   CXR unrevealing. Review of Systems: As per HPI otherwise 10 point review of systems negative.   Past Medical History:  Diagnosis Date  . Anemia   . GERD (gastroesophageal reflux disease)   . HIV (human immunodeficiency virus infection) (HCC)    per patient ("diagnosed in 1994")  . HIV (human immunodeficiency virus infection) (HCC)   . Hypertension   . Paraplegia (HCC)   . Stroke [facility])     Past Surgical History:  Procedure Laterality Date  . toe removed     per patient    Social History Social History   Social History  . Marital status: Divorced    Spouse name: N/A  . Number of children: N/A  .  Years of education: N/A   Occupational History  . Not on file.   Social History Main Topics  . Smoking status: Current Every Day Smoker    Packs/day: 0.50    Years: 38.00    Types: Cigarettes  . Smokeless tobacco: Never Used     Comment: trying to cut back  . Alcohol use 0.6 oz/week    1 Cans of beer per week     Comment: quit five (5) months ago 04/07/16  . Drug use:     Frequency: 1.0 time per week    Types: Marijuana  . Sexual activity: Not Currently    Partners: Male     Comment: pt. declined condoms   Other Topics Concern  . Not on file   Social History Narrative  . No narrative on file     Allergies  Allergen Reactions  . Codeine     Hives   . Sulfamethoxazole-Trimethoprim     REACTION: rash  . Sulfonamide Derivatives     REACTION: RASH    Family History  Problem Relation Age of Onset  . Heart disease Mother       Prior to Admission medications   Medication Sig Start Date End Date Taking? Authorizing Provider  albuterol (PROVENTIL HFA;VENTOLIN HFA) 108 (90 Base) MCG/ACT inhaler Inhale 2 puffs into the  lungs every 6 (six) hours as needed for wheezing or shortness of breath.    Marletta LorJulie Barr, NP  ALPRAZolam Prudy Feeler(XANAX) 0.5 MG tablet Take 1 tablet (0.5 mg total) by mouth every 8 (eight) hours as needed. 12/12/13   Ginnie SmartJeffrey C Hatcher, MD  beclomethasone (BECONASE-AQ) 42 MCG/SPRAY nasal spray Place 1 spray into both nostrils 2 (two) times daily. Dose is for each nostril. 04/07/16   Ginnie SmartJeffrey C Hatcher, MD  cetirizine (ZYRTEC) 10 MG tablet Take 10 mg by mouth daily as needed for allergies.    Marletta LorJulie Barr, NP  ENSURE (ENSURE) Take 237 mLs by mouth 3 (three) times daily between meals. 04/07/16   Ginnie SmartJeffrey C Hatcher, MD  finasteride (PROSCAR) 5 MG tablet Take 5 mg by mouth daily.    Historical Provider, MD  GENVOYA 150-150-200-10 MG TABS tablet TAKE 1 TABLET BY MOUTH EVERY DAY WITH BREAKFAST 05/28/16   Ginnie SmartJeffrey C Hatcher, MD  HYDROcodone-acetaminophen (NORCO/VICODIN) 5-325 MG per tablet  Take 1 tablet by mouth every 6 (six) hours as needed. for pain 10/19/14   Historical Provider, MD  lisinopril (PRINIVIL,ZESTRIL) 5 MG tablet Take 5 mg by mouth daily.    Historical Provider, MD  methocarbamol (ROBAXIN) 500 MG tablet  08/18/14   Historical Provider, MD  Olopatadine HCl 0.2 % SOLN Apply to eye.    Historical Provider, MD  pravastatin (PRAVACHOL) 40 MG tablet Take 40 mg by mouth daily.    Historical Provider, MD  PREZISTA 800 MG tablet TAKE 1 TABLET BY MOUTH EVERY DAY WITH BREAKFAST 05/28/16   Ginnie SmartJeffrey C Hatcher, MD  promethazine (PHENERGAN) 12.5 MG tablet Take 12.5 mg by mouth every 6 (six) hours as needed for nausea or vomiting.    Marletta LorJulie Barr, NP  ranitidine (ZANTAC) 150 MG tablet Take 150 mg by mouth 2 (two) times daily.    Historical Provider, MD  sertraline (ZOLOFT) 100 MG tablet Take 50 mg by mouth daily.     Historical Provider, MD  solifenacin (VESICARE) 5 MG tablet Take 10 mg by mouth daily.    Historical Provider, MD  tadalafil (CIALIS) 5 MG tablet Take 5 mg by mouth at bedtime.    Marletta LorJulie Barr, NP  tamsulosin (FLOMAX) 0.4 MG CAPS capsule Take 0.4 mg by mouth daily.    Historical Provider, MD    Physical Exam:    Vitals:   07/23/16 1249 07/23/16 1251 07/23/16 1258 07/23/16 1259  BP: 127/85  113/64   Pulse: 90  73   Resp: 16  14   SpO2:   98%   Weight:  56.7 kg (125 lb)  63.5 kg (140 lb)  Height:    6\' 1"  (1.854 m)       Constitutional: NAD, calm,very thin, Vitals:   07/23/16 1249 07/23/16 1251 07/23/16 1258 07/23/16 1259  BP: 127/85  113/64   Pulse: 90  73   Resp: 16  14   SpO2:   98%   Weight:  56.7 kg (125 lb)  63.5 kg (140 lb)  Height:    6\' 1"  (1.854 m)   Eyes: PERRL, lids and conjunctivae normal ENMT: Mucous membranes are moist. Posterior pharynx clear of any exudate or lesions.Normal dentition.  Neck: normal, supple, no masses, no thyromegaly Respiratory: clear to auscultation bilaterally, no wheezing, no crackles. Normal respiratory effort. No  accessory muscle use.  Cardiovascular: Regular rate and rhythm, no murmurs / rubs / gallops. No extremity edema. 2+ pedal pulses. No carotid bruits.  Abdomen: no tenderness, no masses palpated. No hepatosplenomegaly.  Bowel sounds positive.  Musculoskeletal: no clubbing / cyanosis. No joint deformity upper and lower extremities. Good ROM, no contractures. Normal muscle tone.  Skin: no rashes, lesions, ulcers.  Neurologic Generalized atrophy Sensation intact, DTR normal. Psychiatric: Normal judgment and insight. Alert and oriented x 3. Normal mood.     Labs on Admission: I have personally reviewed following labs and imaging studies  CBC:  Recent Labs Lab 07/23/16 1347 07/23/16 1400  WBC  --  8.2  NEUTROABS  --  5.0  HGB 9.9* 8.9*  HCT 29.0* 26.0*  MCV  --  82.3  PLT  --  202    Basic Metabolic Panel:  Recent Labs Lab 07/23/16 1347  NA 141  K 3.6  CL 107  GLUCOSE 90  BUN 8  CREATININE 0.80    GFR: Estimated Creatinine Clearance: 90.4 mL/min (by C-G formula based on SCr of 0.8 mg/dL).  Liver Function Tests: No results for input(s): AST, ALT, ALKPHOS, BILITOT, PROT, ALBUMIN in the last 168 hours. No results for input(s): LIPASE, AMYLASE in the last 168 hours. No results for input(s): AMMONIA in the last 168 hours.  Coagulation Profile: No results for input(s): INR, PROTIME in the last 168 hours.  Cardiac Enzymes: No results for input(s): CKTOTAL, CKMB, CKMBINDEX, TROPONINI in the last 168 hours.  BNP (last 3 results) No results for input(s): PROBNP in the last 8760 hours.  HbA1C: No results for input(s): HGBA1C in the last 72 hours.  CBG: No results for input(s): GLUCAP in the last 168 hours.  Lipid Profile: No results for input(s): CHOL, HDL, LDLCALC, TRIG, CHOLHDL, LDLDIRECT in the last 72 hours.  Thyroid Function Tests: No results for input(s): TSH, T4TOTAL, FREET4, T3FREE, THYROIDAB in the last 72 hours.  Anemia Panel: No results for input(s):  VITAMINB12, FOLATE, FERRITIN, TIBC, IRON, RETICCTPCT in the last 72 hours.  Urine analysis:    Component Value Date/Time   COLORURINE YELLOW 02/13/2010 1658   APPEARANCEUR CLEAR 02/13/2010 1658   LABSPEC 1.013 02/13/2010 1658   PHURINE 8.5 (H) 02/13/2010 1658   GLUCOSEU 500 (A) 02/13/2010 1658   GLUCOSEU NEG mg/dL 40/98/1191 4782   HGBUR SMALL (A) 02/13/2010 1658   BILIRUBINUR NEGATIVE 02/13/2010 1658   KETONESUR NEGATIVE 02/13/2010 1658   PROTEINUR 30 (A) 02/13/2010 1658   UROBILINOGEN 0.2 02/13/2010 1658   NITRITE NEGATIVE 02/13/2010 1658   LEUKOCYTESUR NEGATIVE 02/13/2010 1658    Sepsis Labs: @LABRCNTIP (procalcitonin:4,lacticidven:4) )No results found for this or any previous visit (from the past 240 hour(s)).   Radiological Exams on Admission: Dg Chest 2 View  Result Date: 07/23/2016 CLINICAL DATA:  Shortness of breath EXAM: CHEST  2 VIEW COMPARISON:  02/05/2010 FINDINGS: EKG leads create artifact over the chest. There is no edema, consolidation, effusion, or pneumothorax. Symmetric biapical pleural thickening. Normal heart size and mediastinal contours. IMPRESSION: No active cardiopulmonary disease. Electronically Signed   By: Marnee Spring M.D.   On: 07/23/2016 15:08    EKG: Independently reviewed.  Assessment/Plan Active Problems:   Elevated troponin   Human immunodeficiency virus (HIV) disease (HCC)   TOBACCO ABUSE   Paraplegia (HCC)   Essential hypertension   GERD  Elevated Troponin  Heart Score 3- 4 . EKG without evidence of ACS. Tn 0.26   CXR unrevealing.   The patient is asymptomatic at this time, but did have intermittent chest discomfort  for the last 2 months, last episode 2 weeks ago.  Rule out cardiac involvement . Dr. Jacinto Halim to see, likely patient to  have a cardiac acth as per his discretion  Tn  Admit to tele obs EKG, serial Tn continue ASA, O2 and NTG as needed GI cocktail Check Lipid panel  Hb A1C Continue home meds Other plans as per Dr. Nadara EatonGangi  (Cards)  2 D echo  NPO after midnight    Hypertension BP 113/64 (BP Location: Right Arm)   Pulse 73   Resp 14   Ht 6\' 1"  (1.854 m)   Wt 63.5 kg (140 lb)   SpO2 98%   BMI 18.47 kg/m  Controlled Continue home anti-hypertensive medications  Add Hydralazine Q6 hours as needed for BP 160/90   Hyperlipidemia Continue home statins  GERD, no acute symptoms: Continue PPI  Anemia of chronic disease Hemoglobin on admission 8.9  . 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

## 2016-07-23 NOTE — ED Provider Notes (Signed)
[facility]-EMERGENCY DEPT Provider Note   CSN: [id number] Arrival date & time: 07/23/16  1226     History   Chief Complaint Chief Complaint  Patient presents with  . Abnormal ECG    HPI Ellan LambertFranklin M Hessel is a 60 y.o. male.  60 year old male who has no complaints of at this time presents with possible EKG changes from a study that was done yesterday. Patient had EKG done at his home and after his position over read that study there was some concern about possible new changes. Patient himself denies being short of breath, having chest pain, being weak. He states he's been his baseline of health for at least a week. He denies any prior history of MI. EMS called and patient transported here.      No past medical history on file.  Patient Active Problem List   Diagnosis Date Noted  . METABOLIC ACIDOSIS 02/15/2010  . ANEMIA OF OTHER CHRONIC DISEASE 02/15/2010  . OTH SPEC D/O RESULT FROM IMPAIRED RENAL FUNCTION 02/15/2010  . Gastritis and gastroduodenitis 10/25/2009  . UNSPECIFIED ACQUIRED DEFORMITY OF TOE 10/25/2009  . PARAPLEGIA 10/24/2009  . UNSPEC SITE SP CORD INJURY W/O SP BN INJURY 10/24/2009  . ERECTILE DYSFUNCTION 11/22/2008  . CERUMEN IMPACTION, BILATERAL 11/22/2008  . Human immunodeficiency virus (HIV) disease (HCC) 08/11/2008  . TOBACCO ABUSE 08/11/2008  . Essential hypertension 08/11/2008  . GERD 08/11/2008  . TRANSIENT ISCHEMIC ATTACK, HX OF 08/11/2008  . NEPHROLITHIASIS, HX OF 08/11/2008    No past surgical history on file.     Home Medications    Prior to Admission medications   Medication Sig Start Date End Date Taking? Authorizing Provider  albuterol (PROVENTIL HFA;VENTOLIN HFA) 108 (90 Base) MCG/ACT inhaler Inhale 2 puffs into the lungs every 6 (six) hours as needed for wheezing or shortness of breath.    Marletta LorJulie Barr, NP  ALPRAZolam Prudy Feeler(XANAX) 0.5 MG tablet Take 1 tablet (0.5 mg total) by mouth every 8 (eight) hours as needed. 12/12/13   Ginnie SmartJeffrey C Hatcher,  MD  beclomethasone (BECONASE-AQ) 42 MCG/SPRAY nasal spray Place 1 spray into both nostrils 2 (two) times daily. Dose is for each nostril. 04/07/16   Ginnie SmartJeffrey C Hatcher, MD  cetirizine (ZYRTEC) 10 MG tablet Take 10 mg by mouth daily as needed for allergies.    Marletta LorJulie Barr, NP  ENSURE (ENSURE) Take 237 mLs by mouth 3 (three) times daily between meals. 04/07/16   Ginnie SmartJeffrey C Hatcher, MD  finasteride (PROSCAR) 5 MG tablet Take 5 mg by mouth daily.    Historical Provider, MD  GENVOYA 150-150-200-10 MG TABS tablet TAKE 1 TABLET BY MOUTH EVERY DAY WITH BREAKFAST 05/28/16   Ginnie SmartJeffrey C Hatcher, MD  HYDROcodone-acetaminophen (NORCO/VICODIN) 5-325 MG per tablet Take 1 tablet by mouth every 6 (six) hours as needed. for pain 10/19/14   Historical Provider, MD  lisinopril (PRINIVIL,ZESTRIL) 5 MG tablet Take 5 mg by mouth daily.    Historical Provider, MD  methocarbamol (ROBAXIN) 500 MG tablet  08/18/14   Historical Provider, MD  Olopatadine HCl 0.2 % SOLN Apply to eye.    Historical Provider, MD  pravastatin (PRAVACHOL) 40 MG tablet Take 40 mg by mouth daily.    Historical Provider, MD  PREZISTA 800 MG tablet TAKE 1 TABLET BY MOUTH EVERY DAY WITH BREAKFAST 05/28/16   Ginnie SmartJeffrey C Hatcher, MD  promethazine (PHENERGAN) 12.5 MG tablet Take 12.5 mg by mouth every 6 (six) hours as needed for nausea or vomiting.    Marletta LorJulie Barr, NP  ranitidine (  ZANTAC) 150 MG tablet Take 150 mg by mouth 2 (two) times daily.    Historical Provider, MD  sertraline (ZOLOFT) 100 MG tablet Take 50 mg by mouth daily.     Historical Provider, MD  solifenacin (VESICARE) 5 MG tablet Take 10 mg by mouth daily.    Historical Provider, MD  tadalafil (CIALIS) 5 MG tablet Take 5 mg by mouth at bedtime.    Marletta Lor, NP  tamsulosin (FLOMAX) 0.4 MG CAPS capsule Take 0.4 mg by mouth daily.    Historical Provider, MD    Family History No family history on file.  Social History Social History  Substance Use Topics  . Smoking status: Current Every Day Smoker     Packs/day: 0.50    Years: 38.00    Types: Cigarettes  . Smokeless tobacco: Never Used     Comment: trying to cut back  . Alcohol use No     Comment: quit five (5) months ago 04/07/16     Allergies   Codeine; Sulfamethoxazole-trimethoprim; and Sulfonamide derivatives   Review of Systems Review of Systems  All other systems reviewed and are negative.    Physical Exam Updated Vital Signs BP 113/64 (BP Location: Right Arm)   Pulse 73   Resp 14   Ht 6\' 1"  (1.854 m)   Wt 63.5 kg   SpO2 98%   BMI 18.47 kg/m   Physical Exam  Constitutional: He is oriented to person, place, and time. He appears well-developed and well-nourished.  Non-toxic appearance. No distress.  HENT:  Head: Normocephalic and atraumatic.  Eyes: Conjunctivae, EOM and lids are normal. Pupils are equal, round, and reactive to light.  Neck: Normal range of motion. Neck supple. No tracheal deviation present. No thyroid mass present.  Cardiovascular: Normal rate, regular rhythm and normal heart sounds.  Exam reveals no gallop.   No murmur heard. Pulmonary/Chest: Effort normal and breath sounds normal. No stridor. No respiratory distress. He has no decreased breath sounds. He has no wheezes. He has no rhonchi. He has no rales.  Abdominal: Soft. Normal appearance and bowel sounds are normal. He exhibits no distension. There is no tenderness. There is no rebound and no CVA tenderness.  Musculoskeletal: Normal range of motion. He exhibits no edema or tenderness.  Neurological: He is alert and oriented to person, place, and time. He displays atrophy. No cranial nerve deficit or sensory deficit. GCS eye subscore is 4. GCS verbal subscore is 5. GCS motor subscore is 6.  Skin: Skin is warm and dry. No abrasion and no rash noted.  Psychiatric: He has a normal mood and affect. His speech is normal and behavior is normal.  Nursing note and vitals reviewed.    ED Treatments / Results  Labs (all labs ordered are listed, but  only abnormal results are displayed) Labs Reviewed - No data to display  EKG  EKG Interpretation  Date/Time:  Wednesday July 23 2016 12:27:00 EST Ventricular Rate:  77 PR Interval:    QRS Duration: 99 QT Interval:  404 QTC Calculation: 458 R Axis:   32 Text Interpretation:  Sinus rhythm Borderline low voltage, extremity leads No significant change since last tracing Confirmed by Adriann Thau  MD, Elora Wolter ([id number]) on 07/23/2016 12:54:00 PM       Radiology No results found.  Procedures Procedures (including critical care time)  Medications Ordered in ED Medications - No data to display   Initial Impression / Assessment and Plan / ED Course  I have reviewed the  triage vital signs and the nursing notes.  Pertinent labs & imaging results that were available during my care of the patient were reviewed by me and considered in my medical decision making (see chart for details).  Clinical Course     Will give patient aspirin here.E KG without acute findings from prior studies but according to Dr. Nadara EatonGangi, cardiology on-call, he reviewed EKG that was done yesterday and he said that the primary care doctor was concern for ischemic changes. Patient is asymptomatic at this time. His troponin is elevated and Dr. Nadara EatonGangi requests patient to be admitted to medicine service  Final Clinical Impressions(s) / ED Diagnoses   Final diagnoses:  None    New Prescriptions New Prescriptions   No medications on file     Lorre NickAnthony Raiya Stainback, MD 07/23/16 1406

## 2016-07-23 NOTE — ED Notes (Signed)
EKG given to Dr. Allen 

## 2016-07-24 ENCOUNTER — Observation Stay (HOSPITAL_BASED_OUTPATIENT_CLINIC_OR_DEPARTMENT_OTHER): Payer: Medicaid Other

## 2016-07-24 DIAGNOSIS — R748 Abnormal levels of other serum enzymes: Secondary | ICD-10-CM | POA: Diagnosis not present

## 2016-07-24 DIAGNOSIS — I10 Essential (primary) hypertension: Secondary | ICD-10-CM | POA: Diagnosis not present

## 2016-07-24 DIAGNOSIS — B20 Human immunodeficiency virus [HIV] disease: Secondary | ICD-10-CM | POA: Diagnosis not present

## 2016-07-24 DIAGNOSIS — R079 Chest pain, unspecified: Secondary | ICD-10-CM | POA: Diagnosis not present

## 2016-07-24 DIAGNOSIS — F17200 Nicotine dependence, unspecified, uncomplicated: Secondary | ICD-10-CM | POA: Diagnosis not present

## 2016-07-24 LAB — RETICULOCYTES
RBC.: 3.07 MIL/uL — AB (ref 4.22–5.81)
RETIC COUNT ABSOLUTE: 49.1 10*3/uL (ref 19.0–186.0)
Retic Ct Pct: 1.6 % (ref 0.4–3.1)

## 2016-07-24 LAB — IRON AND TIBC
Iron: 46 ug/dL (ref 45–182)
SATURATION RATIOS: 23 % (ref 17.9–39.5)
TIBC: 197 ug/dL — ABNORMAL LOW (ref 250–450)
UIBC: 151 ug/dL

## 2016-07-24 LAB — ECHOCARDIOGRAM COMPLETE
HEIGHTINCHES: 73 in
Weight: 2016 oz

## 2016-07-24 LAB — FERRITIN: Ferritin: 126 ng/mL (ref 24–336)

## 2016-07-24 LAB — FOLATE: FOLATE: 26.8 ng/mL (ref >5.9)

## 2016-07-24 LAB — TSH: TSH: 0.999 u[IU]/mL (ref 0.350–4.500)

## 2016-07-24 LAB — VITAMIN B12: VITAMIN B 12: 217 pg/mL (ref 180–914)

## 2016-07-24 MED ORDER — ASPIRIN 81 MG PO CHEW
81.0000 mg | CHEWABLE_TABLET | Freq: Every day | ORAL | Status: DC
  Administered 2016-07-24: 81 mg via ORAL
  Filled 2016-07-24: qty 1

## 2016-07-24 NOTE — Discharge Summary (Signed)
Physician Discharge Summary  Kent Barnes ZOX:[id number] DOB: 1956-02-11 DOA: 07/23/2016  PCP: Marletta LorBarr, Julie, NP  Admit date: 07/23/2016 Discharge date: 07/25/2016  Admitted From: home Disposition:  Home  Recommendations for Outpatient Follow-up:  1. Follow up with PCP in 1-2 weeks. 2. Please obtain BMP/CBC in one week  Home Health: No Equipment/Devices: None  Discharge Condition: Stable CODE STATUS: Full Diet recommendation: Heart Healthy  Brief/Interim Summary: 60 y.o. 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 The patient denies any shortness of breath, chest pain at this time, or having increased generalized weakness. Of note, he did have intermittent chest discomfort about 2 months ago, which has come and go in many occasions, last episode 2 weeks ago.  Discharge Diagnoses:  Active Problems:   Human immunodeficiency virus (HIV) disease (HCC)   TOBACCO ABUSE   Paraplegia (HCC)   Essential hypertension   GERD   Elevated troponin  Elevated troponin: 3 sets of cardiac biomarkers are elevated but flat. Normal sinus rhythm nonspecific T-wave changes. No events on telemetry. Continue aspirin. 2-D echo EF 50-60%,  With right RV dilation, cardiology's stress test as an outpatient.  Normocytic anemia: Normal MCV, with elevated RDW.  Human immunodeficiency virus (HIV) disease (HCC): No changes in his medications.  TOBACCO ABUSE  Paraplegia (HCC)  Essential hypertension  GERD  Discharge Instructions  Discharge Instructions    Diet - low sodium heart healthy    Complete by:  As directed    Increase activity slowly    Complete by:  As directed        Medication List    TAKE these medications   albuterol 108 (90 Base) MCG/ACT  inhaler Commonly known as:  PROVENTIL HFA;VENTOLIN HFA Inhale 2 puffs into the lungs every 6 (six) hours as needed for wheezing or shortness of breath.   ALPRAZolam 0.5 MG tablet Commonly known as:  XANAX Take 1 tablet (0.5 mg total) by mouth every 8 (eight) hours as needed.   beclomethasone 42 MCG/SPRAY nasal spray Commonly known as:  BECONASE-AQ Place 1 spray into both nostrils 2 (two) times daily. Dose is for each nostril.   cetirizine 10 MG tablet Commonly known as:  ZYRTEC Take 10 mg by mouth daily as needed for allergies.   ENSURE Take 237 mLs by mouth 3 (three) times daily between meals.   finasteride 5 MG tablet Commonly known as:  PROSCAR Take 5 mg by mouth daily.   GENVOYA 150-150-200-10 MG Tabs tablet Generic drug:  elvitegravir-cobicistat-emtricitabine-tenofovir TAKE 1 TABLET BY MOUTH EVERY DAY WITH BREAKFAST   HYDROcodone-acetaminophen 5-325 MG tablet Commonly known as:  NORCO/VICODIN Take 1 tablet by mouth every 6 (six) hours as needed. for pain   lisinopril 5 MG tablet Commonly known as:  PRINIVIL,ZESTRIL Take 5 mg by mouth daily.   methocarbamol 500 MG tablet Commonly known as:  ROBAXIN   Olopatadine HCl 0.2 % Soln Apply to eye.   pravastatin 40 MG tablet Commonly known as:  PRAVACHOL Take 40 mg by mouth daily.   PREZISTA 800 MG tablet Generic drug:  Darunavir Ethanolate TAKE 1 TABLET BY MOUTH EVERY DAY WITH BREAKFAST   promethazine 12.5 MG tablet Commonly known as:  PHENERGAN Take 12.5 mg by mouth every 6 (six) hours as needed for nausea or vomiting.  ranitidine 150 MG tablet Commonly known as:  ZANTAC Take 150 mg by mouth 2 (two) times daily.   sertraline 100 MG tablet Commonly known as:  ZOLOFT Take 50 mg by mouth daily.   tadalafil 5 MG tablet Commonly known as:  CIALIS Take 5 mg by mouth at bedtime.   tamsulosin 0.4 MG Caps capsule Commonly known as:  FLOMAX Take 0.4 mg by mouth daily.   VESICARE 5 MG tablet Generic drug:   solifenacin Take 10 mg by mouth daily.       Allergies  Allergen Reactions  . Codeine     Hives   . Sulfamethoxazole-Trimethoprim     REACTION: rash  . Sulfonamide Derivatives     REACTION: RASH    Consultations:  Cardiology   Procedures/Studies: Dg Chest 2 View  Result Date: 07/23/2016 CLINICAL DATA:  Shortness of breath EXAM: CHEST  2 VIEW COMPARISON:  02/05/2010 FINDINGS: EKG leads create artifact over the chest. There is no edema, consolidation, effusion, or pneumothorax. Symmetric biapical pleural thickening. Normal heart size and mediastinal contours. IMPRESSION: No active cardiopulmonary disease. Electronically Signed   By: Marnee SpringJonathon  Watts M.D.   On: 07/23/2016 15:08   Echo 11.23.2017: showed EF 50%, right RV dilation.  Subjective: No complains, chest pain free.  Discharge Exam: Vitals:   07/24/16 1100 07/24/16 1300  BP: 122/72   Pulse:  81  Resp:  15  Temp:  98.1 F (36.7 C)   Vitals:   07/23/16 2126 07/24/16 0600 07/24/16 1100 07/24/16 1300  BP: 125/72 120/68 122/72   Pulse: 89 79  81  Resp:    15  Temp: 99.1 F (37.3 C) 98.5 F (36.9 C)  98.1 F (36.7 C)  TempSrc: Oral Oral  Oral  SpO2: 97% 97%  98%  Weight:      Height:        General: Pt is alert, awake, not in acute distress Cardiovascular: RRR, S1/S2 +, no rubs, no gallops Respiratory: CTA bilaterally, no wheezing, no rhonchi Abdominal: Soft, NT, ND, bowel sounds + Extremities: no edema, no cyanosis    The results of significant diagnostics from this hospitalization (including imaging, microbiology, ancillary and laboratory) are listed below for reference.     Microbiology: No results found for this or any previous visit (from the past 240 hour(s)).   Labs: BNP (last 3 results) No results for input(s): BNP in the last 8760 hours. Basic Metabolic Panel:  Recent Labs Lab 07/23/16 1347  NA 141  K 3.6  CL 107  GLUCOSE 90  BUN 8  CREATININE 0.80   Liver Function Tests: No  results for input(s): AST, ALT, ALKPHOS, BILITOT, PROT, ALBUMIN in the last 168 hours. No results for input(s): LIPASE, AMYLASE in the last 168 hours. No results for input(s): AMMONIA in the last 168 hours. CBC:  Recent Labs Lab 07/23/16 1347 07/23/16 1400  WBC  --  8.2  NEUTROABS  --  5.0  HGB 9.9* 8.9*  HCT 29.0* 26.0*  MCV  --  82.3  PLT  --  202   Cardiac Enzymes:  Recent Labs Lab 07/23/16 1640 07/23/16 1839 07/23/16 2138  TROPONINI 0.27* 0.32* 0.31*   BNP: Invalid input(s): POCBNP CBG: No results for input(s): GLUCAP in the last 168 hours. D-Dimer No results for input(s): DDIMER in the last 72 hours. Hgb A1c No results for input(s): HGBA1C in the last 72 hours. Lipid Profile No results for input(s): CHOL, HDL, LDLCALC, TRIG, CHOLHDL, LDLDIRECT in the last 72  hours. Thyroid function studies  Recent Labs  07/24/16 0910  TSH 0.999   Anemia work up  Recent Labs  07/24/16 0910  VITAMINB12 217  FOLATE 26.8  FERRITIN 126  TIBC 197*  IRON 46  RETICCTPCT 1.6   Urinalysis    Component Value Date/Time   COLORURINE YELLOW 02/13/2010 1658   APPEARANCEUR CLEAR 02/13/2010 1658   LABSPEC 1.013 02/13/2010 1658   PHURINE 8.5 (H) 02/13/2010 1658   GLUCOSEU 500 (A) 02/13/2010 1658   GLUCOSEU NEG mg/dL 29/56/2130 8657   HGBUR SMALL (A) 02/13/2010 1658   BILIRUBINUR NEGATIVE 02/13/2010 1658   KETONESUR NEGATIVE 02/13/2010 1658   PROTEINUR 30 (A) 02/13/2010 1658   UROBILINOGEN 0.2 02/13/2010 1658   NITRITE NEGATIVE 02/13/2010 1658   LEUKOCYTESUR NEGATIVE 02/13/2010 1658   Sepsis Labs Invalid input(s): PROCALCITONIN,  WBC,  LACTICIDVEN Microbiology No results found for this or any previous visit (from the past 240 hour(s)).   Time coordinating discharge: Over 30 minutes  SIGNED:   Marinda Elk, MD  [facility] 07/25/2016, 8:05 AM Pager   If 7PM-7AM, please contact night-coverage [email or web address] Password [email or web address]

## 2016-07-24 NOTE — Progress Notes (Signed)
Echocardiogram 2D Echocardiogram has been performed.  Kent Barnes 07/24/2016, 4:52 PM

## 2016-07-24 NOTE — Consult Note (Signed)
CARDIOLOGY CONSULT NOTE  Patient ID: Kent Barnes Bronkema MRN: [id number] DOB/AGE: Apr 28, 1956 60 y.o.  Admit date: 07/23/2016 Referring Physician TR[facility] Primary Physician:  Marletta LorBarr, Julie, NP Reason for Consultation   Elevated troponin, chest pain  HPI: Kent Barnes [name]  is a 60 y.o. male  With History of HIV infection probably related to his sexual preference, hypertension, mild hypertriglyceridemia probably related to HIV, chronic anemia and history of paraplegia and who still able to walk with the help of a walker and able to control his bladder and bowels, lives independently and cooks and does his household chores himself.  He was evaluated by his PCP, [facility]ie Barr, [facility] for routine visit in his house, an EKG was obtained and felt that he probably has had myocardial infarction and needed to go to the emergency room. He denied any chest pain or shortness of breath. He was in his usual state of health. Only on probing he states that about 2 months ago he was laughing hard and had sharp pain that disappeared after he took it easy for a few minutes. Otherwise he essentially is asymptomatic. No complaints this morning.  Past Medical History:  Diagnosis Date  . Anemia   . Chronic lower back pain   . Depression   . GERD (gastroesophageal reflux disease)   . History of blood transfusion 2010  . HIV (human immunodeficiency virus infection) (HCC)    diagnosed in 1994"  . Hypertension   . Paraplegia [facility](HCC)    "can manage with a walker most of the time" (07/23/2016)  . Stroke [facility](HCC) 2010   BLE weakness     Past Surgical History:  Procedure Laterality Date  . KNEE LIGAMENT RECONSTRUCTION Left 1977   "tore ligament"  . TOE AMPUTATION Right 2011   "dropped piece of wood on my toe in 2010; they think that caused a stroke"     Family History  Problem Relation Age of Onset  . Heart disease Mother      Social History: Social History   Social History  . Marital status: Divorced    Spouse  name: N/A  . Number of children: N/A  . Years of education: N/A   Occupational History  . Not on file.   Social History Main Topics  . Smoking status: Current Every Day Smoker    Packs/day: 0.33    Years: 41.00    Types: Cigarettes  . Smokeless tobacco: Never Used     Comment: trying to cut back  . Alcohol use 0.6 oz/week    1 Cans of beer per week     Comment: 07/23/2016 "quit drinking 10/07/2015"  . Drug use:     Frequency: 1.0 time per week    Types: Marijuana     Comment: 07/23/2016 "use marijuana ~ once/wk"  . Sexual activity: Not Currently    Partners: Male   Other Topics Concern  . Not on file   Social History Narrative  . No narrative on file     Prescriptions Prior to Admission  Medication Sig Dispense Refill Last Dose  . albuterol (PROVENTIL HFA;VENTOLIN HFA) 108 (90 Base) MCG/ACT inhaler Inhale 2 puffs into the lungs every 6 (six) hours as needed for wheezing or shortness of breath.   Past Month at Unknown time  . ALPRAZolam (XANAX) 0.5 MG tablet Take 1 tablet (0.5 mg total) by mouth every 8 (eight) hours as needed. 60 tablet 0 Past Week at Unknown time  . beclomethasone (BECONASE-AQ) 42 MCG/SPRAY nasal spray Place  1 spray into both nostrils 2 (two) times daily. Dose is for each nostril. 25 g 5 07/22/2016 at Unknown time  . cetirizine (ZYRTEC) 10 MG tablet Take 10 mg by mouth daily as needed for allergies.   07/23/2016 at Unknown time  . ENSURE (ENSURE) Take 237 mLs by mouth 3 (three) times daily between meals. 237 mL 5 Past Month at Unknown time  . finasteride (PROSCAR) 5 MG tablet Take 5 mg by mouth daily.   07/23/2016 at Unknown time  . GENVOYA 150-150-200-10 MG TABS tablet TAKE 1 TABLET BY MOUTH EVERY DAY WITH BREAKFAST 90 tablet 2 07/23/2016 at Unknown time  . HYDROcodone-acetaminophen (NORCO/VICODIN) 5-325 MG per tablet Take 1 tablet by mouth every 6 (six) hours as needed. for pain  0 Past Week at Unknown time  . lisinopril (PRINIVIL,ZESTRIL) 5 MG tablet Take 5  mg by mouth daily.   07/23/2016 at Unknown time  . methocarbamol (ROBAXIN) 500 MG tablet   1 07/23/2016 at Unknown time  . Olopatadine HCl 0.2 % SOLN Apply to eye.   Past Week at Unknown time  . pravastatin (PRAVACHOL) 40 MG tablet Take 40 mg by mouth daily.   07/23/2016 at Unknown time  . PREZISTA 800 MG tablet TAKE 1 TABLET BY MOUTH EVERY DAY WITH BREAKFAST 90 tablet 2 07/23/2016 at Unknown time  . promethazine (PHENERGAN) 12.5 MG tablet Take 12.5 mg by mouth every 6 (six) hours as needed for nausea or vomiting.   Past Week at Unknown time  . ranitidine (ZANTAC) 150 MG tablet Take 150 mg by mouth 2 (two) times daily.   07/23/2016 at Unknown time  . sertraline (ZOLOFT) 100 MG tablet Take 50 mg by mouth daily.    07/23/2016 at Unknown time  . solifenacin (VESICARE) 5 MG tablet Take 10 mg by mouth daily.   07/23/2016 at Unknown time  . tadalafil (CIALIS) 5 MG tablet Take 5 mg by mouth at bedtime.   Past Week at Unknown time  . tamsulosin (FLOMAX) 0.4 MG CAPS capsule Take 0.4 mg by mouth daily.   07/23/2016 at Unknown time     ROS: General: no fevers/chills/night sweats Eyes: no blurry vision, diplopia, or amaurosis ENT: no sore throat or hearing loss Resp: no cough, wheezing, or hemoptysis CV: no edema or palpitations GI: no abdominal pain, nausea, vomiting, diarrhea, or constipation GU: no dysuria, frequency, or hematuria Skin: no rash Neuro: Paraplegia present, other systems negative.   Physical Exam: Blood pressure 120/68, pulse 79, temperature 98.5 F (36.9 C), temperature source Oral, resp. rate 18, height 6\' 1"  (1.854 Barnes), weight 57.2 kg (126 lb), SpO2 97 %.   General appearance: alert, cooperative, appears stated age and no distress Neck: no adenopathy, no carotid bruit, no JVD, supple, symmetrical, trachea midline and thyroid not enlarged, symmetric, no tenderness/mass/nodules Heart: regular rate and rhythm, S1, S2 normal, no murmur, click, rub or gallop Abdomen: soft,  non-tender; bowel sounds normal; no masses,  no organomegaly Extremities: Paraplegia present. No edema. Upper extremity normal. Pulses: 2+ and symmetric Neurologic: Grossly normal  Labs:   Lab Results  Component Value Date   WBC 8.2 07/23/2016   HGB 8.9 (L) 07/23/2016   HCT 26.0 (L) 07/23/2016   MCV 82.3 07/23/2016   PLT 202 07/23/2016    Recent Labs Lab 07/23/16 1347  NA 141  K 3.6  CL 107  BUN 8  CREATININE 0.80  GLUCOSE 90    Lipid Panel     Component Value Date/Time  CHOL 97 (L) 03/24/2016 1100   TRIG 48 03/24/2016 1100   HDL 56 03/24/2016 1100   CHOLHDL 1.7 03/24/2016 1100   VLDL 10 03/24/2016 1100   LDLCALC 31 03/24/2016 1100    HEMOGLOBIN A1C Lab Results  Component Value Date   HGBA1C 5.0 05/30/2015   MPG 97 05/30/2015    Cardiac Panel (last 3 results)  Recent Labs  07/23/16 1640 07/23/16 1839 07/23/16 2138  TROPONINI 0.27* 0.32* 0.31*    Lab Results  Component Value Date   CKTOTAL 432 (H) 08/16/2009   TROPONINI 0.31 (HH) 07/23/2016     TSH No results for input(s): TSH in the last 8760 hours.  EKG: normal EKG, normal sinus rhythm, unchanged from previous tracings.   Radiology: Dg Chest 2 View  Result Date: 07/23/2016 CLINICAL DATA:  Shortness of breath EXAM: CHEST  2 VIEW COMPARISON:  02/05/2010 FINDINGS: EKG leads create artifact over the chest. There is no edema, consolidation, effusion, or pneumothorax. Symmetric biapical pleural thickening. Normal heart size and mediastinal contours. IMPRESSION: No active cardiopulmonary disease. Electronically Signed   By: Marnee Spring Barnes.D.   On: 07/23/2016 15:08    Scheduled Meds: . beclomethasone  1 spray Each Nare BID  . darifenacin  7.5 mg Oral Daily  . Darunavir Ethanolate  800 mg Oral Q breakfast  . elvitegravir-cobicistat-emtricitabine-tenofovir  1 tablet Oral Q breakfast  . enoxaparin (LOVENOX) injection  40 mg Subcutaneous Q24H  . famotidine  20 mg Oral Daily  . finasteride  5 mg  Oral Daily  . lisinopril  5 mg Oral Daily  . pravastatin  40 mg Oral q1800  . sertraline  50 mg Oral Daily  . tamsulosin  0.4 mg Oral Daily   Continuous Infusions: . sodium chloride 10 mL/hr (07/23/16 1443)  . sodium chloride 75 mL/hr at 07/23/16 1800   PRN Meds:.acetaminophen, albuterol, ALPRAZolam, gi cocktail, ondansetron (ZOFRAN) IV  ASSESSMENT AND PLAN:  1. Chest pain that occurred 2 months ago clearly musculoskeletal one patient was laughing incessantly. No recurrence, essentially asymptomatic. 2. Abnormal serum troponin, nonspecific finding with flat trend. 3. Hypertension 4. Mild hyperlipidemia presently on appropriate medical therapy  Recommendation: I'Barnes not sure that he needs further cardiac workup. He is essentially asymptomatic with a normal EKG. Smoking cessation was discussed with the patient, he is now very concerned about potential side effects and long-term CV effects of smoking and he is willing to quit. I be happy to see her back at any  Time if cardiac issues were to arise. Given his anemia that is chronic, as there is high risk and incidence of CAD in patients with HIV infection, we could certainly consider aspirin 81 mg daily and have a low threshold to discontinue this if he were to have any adverse effects. Yates Decamp, MD 07/24/2016, 9:35 AM [facility] Pager: [phone] Office: [phone] If no answer Cell [phone]

## 2016-07-25 LAB — FOLATE RBC
FOLATE, HEMOLYSATE: 345.7 ng/mL
Folate, RBC: 1295 ng/mL (ref >498)
HEMATOCRIT: 26.7 % — AB (ref 37.5–51.0)

## 2016-07-25 LAB — HEMOGLOBIN A1C
Hgb A1c MFr Bld: 4.5 % — ABNORMAL LOW (ref 4.8–5.6)
Mean Plasma Glucose: 82 mg/dL

## 2016-08-13 ENCOUNTER — Other Ambulatory Visit: Payer: Self-pay | Admitting: Pharmacist Clinician (PhC)/ Clinical Pharmacy Specialist

## 2016-08-13 MED ORDER — FLUNISOLIDE 25 MCG/ACT (0.025%) NA SOLN: 2.0000 | Freq: Two times a day (BID) | NASAL | 4 refills | Status: AC

## 2016-08-13 NOTE — Progress Notes (Signed)
Trying flunisolide to see if they will cover it.

## 2016-09-25 ENCOUNTER — Other Ambulatory Visit: Payer: Self-pay | Admitting: *Deleted

## 2016-09-25 DIAGNOSIS — B20 Human immunodeficiency virus [HIV] disease: Secondary | ICD-10-CM

## 2016-09-25 MED ORDER — ENSURE PO LIQD: 237.0000 mL | Freq: Three times a day (TID) | ORAL | 5 refills | Status: AC

## 2016-10-29 ENCOUNTER — Telehealth: Payer: Self-pay | Admitting: Infectious Diseases

## 2016-10-29 NOTE — Telephone Encounter (Signed)
Received death certifciate from funeral home.  [facility]. He was not admitted.

## 2016-10-30 DEATH — deceased
